# Patient Record
Sex: Female | Born: 1965
Health system: Southern US, Community
[De-identification: ages and names within clinical notes are randomized; demographics above are authoritative.]

## PROBLEM LIST (undated history)

## (undated) DIAGNOSIS — E669 Obesity, unspecified: Secondary | ICD-10-CM

## (undated) DIAGNOSIS — R809 Proteinuria, unspecified: Secondary | ICD-10-CM

## (undated) DIAGNOSIS — E785 Hyperlipidemia, unspecified: Secondary | ICD-10-CM

## (undated) DIAGNOSIS — R51 Headache: Secondary | ICD-10-CM

## (undated) DIAGNOSIS — I10 Essential (primary) hypertension: Secondary | ICD-10-CM

## (undated) DIAGNOSIS — D219 Benign neoplasm of connective and other soft tissue, unspecified: Secondary | ICD-10-CM

## (undated) HISTORY — DX: Headache: R51

## (undated) HISTORY — DX: Proteinuria, unspecified: R80.9

## (undated) HISTORY — PX: ENDOMETRIAL ABLATION: SHX621

## (undated) HISTORY — DX: Essential (primary) hypertension: I10

## (undated) HISTORY — DX: Benign neoplasm of connective and other soft tissue, unspecified: D21.9

## (undated) HISTORY — PX: WISDOM TOOTH EXTRACTION: SHX21

## (undated) HISTORY — DX: Obesity, unspecified: E66.9

## (undated) HISTORY — DX: Hyperlipidemia, unspecified: E78.5

---

## 1997-08-14 ENCOUNTER — Inpatient Hospital Stay (HOSPITAL_COMMUNITY): Admission: AD | Admit: 1997-08-14 | Discharge: 1997-08-17 | Payer: Self-pay | Admitting: Obstetrics and Gynecology

## 1998-03-03 ENCOUNTER — Emergency Department (HOSPITAL_COMMUNITY): Admission: EM | Admit: 1998-03-03 | Discharge: 1998-03-03 | Payer: Self-pay | Admitting: Emergency Medicine

## 1998-03-13 ENCOUNTER — Other Ambulatory Visit: Admission: RE | Admit: 1998-03-13 | Discharge: 1998-03-13 | Payer: Self-pay | Admitting: *Deleted

## 1998-06-18 ENCOUNTER — Encounter: Admission: RE | Admit: 1998-06-18 | Discharge: 1998-06-18 | Payer: Self-pay | Admitting: *Deleted

## 2000-12-06 ENCOUNTER — Other Ambulatory Visit: Admission: RE | Admit: 2000-12-06 | Discharge: 2000-12-06 | Payer: Self-pay | Admitting: Obstetrics and Gynecology

## 2001-12-12 ENCOUNTER — Other Ambulatory Visit: Admission: RE | Admit: 2001-12-12 | Discharge: 2001-12-12 | Payer: Self-pay | Admitting: Obstetrics and Gynecology

## 2004-01-23 ENCOUNTER — Other Ambulatory Visit: Admission: RE | Admit: 2004-01-23 | Discharge: 2004-01-23 | Payer: Self-pay | Admitting: Obstetrics and Gynecology

## 2004-11-06 ENCOUNTER — Other Ambulatory Visit: Admission: RE | Admit: 2004-11-06 | Discharge: 2004-11-06 | Payer: Self-pay | Admitting: Obstetrics and Gynecology

## 2005-12-16 ENCOUNTER — Other Ambulatory Visit: Admission: RE | Admit: 2005-12-16 | Discharge: 2005-12-16 | Payer: Self-pay | Admitting: Obstetrics and Gynecology

## 2007-01-28 ENCOUNTER — Encounter: Admission: RE | Admit: 2007-01-28 | Discharge: 2007-01-28 | Payer: Self-pay | Admitting: Obstetrics and Gynecology

## 2007-02-14 ENCOUNTER — Ambulatory Visit (HOSPITAL_COMMUNITY): Admission: RE | Admit: 2007-02-14 | Discharge: 2007-02-14 | Payer: Self-pay | Admitting: Obstetrics and Gynecology

## 2007-02-14 ENCOUNTER — Encounter (INDEPENDENT_AMBULATORY_CARE_PROVIDER_SITE_OTHER): Payer: Self-pay | Admitting: Obstetrics and Gynecology

## 2010-11-25 NOTE — Op Note (Signed)
NAME:  Kathleen Mata, Kathleen Mata                   ACCOUNT NO.:  [id number]   MEDICAL RECORD NO.:  [id number]          PATIENT TYPE:  AMB   LOCATION:  SDC                           FACILITY:  [facility]   PHYSICIAN:  Naima A. Dillard, M.D. DATE OF BIRTH:  03/06/1966   DATE OF PROCEDURE:  02/14/2007  DATE OF DISCHARGE:  02/14/2007                               OPERATIVE REPORT   PREOPERATIVE DIAGNOSIS:  Irregular bleeding and uterine fibroids.   POSTOPERATIVE DIAGNOSIS:  Irregular bleeding and submucosal uterine  fibroid.   PROCEDURES:  A dilation and curettage hysteroscopy, hysteroscopic  resection of uterine fibroid and ThermaChoice endometrial ablation.   SURGEON:  Naima A. Dillard, M.D.   ASSISTANTS:  None.   ANESTHESIA:  General laryngeal mask airway and cervical block with  lidocaine.   COMPLICATIONS:  None.   FINDINGS:  10 weeks size uterus.  No adnexal masses.  A small submucosal  fibroid at the fundus of the uterus.  Both ostia were visualized.  There  are no endometrial polyps noted.  All specimens were sent to pathology  and endometrial curettings and the fibroid sent to pathology.  There  were no complications.  The patient to recovery room in stable  condition.   PROCEDURE IN DETAIL:  The patient was taken to the operating room where  she was given general anesthesia, placed in dorsal lithotomy position  and prepped and draped in a normal sterile fashion.  A bivalve speculum  was placed into the vagina.  The anterior lip of the cervix was grasped  with single-tooth tenaculum and the uterus did sound to 10 cm.  The  hysteroscope was placed into the uterine cavity and there was a small  submucosal fibroid seen at the fundus.  Both ostia were visualized.  No  polyps were seen.  The hysteroscope was then removed.  The cervix was  further dilated with [facility] dilators.  The resectoscope was placed into  the uterine cavity and using a single loop with cautery, the fibroid was  removed  without difficulty and looking a second time, the fibroid was  then removed in its entirety and sent to pathology.  Sharp curettage was  then done and endometrial curettings were all sent to pathology.  At  this time the ThermaChoice endometrial ablation was done per protocol  without difficulty.  After looking after ThermaChoice, most of the  cavity and the entire portion of the cavity was ablated without  difficulty.  There were no complications.  All instruments were removed  from the vagina.  Sponge, lap and needle counts were correct.  The  patient to recovery room in stable condition.     Naima A. Normand Sloop, M.D.  Electronically Signed    NAD/MEDQ  D:  02/15/2007  T:  02/16/2007  Job:  [id number]

## 2011-04-27 LAB — CBC
HCT: 38.4
Hemoglobin: 13.2
MCHC: 34.4
MCV: 91
Platelets: 243
RBC: 4.22
RDW: 12.2
WBC: 4.7

## 2011-04-27 LAB — HCG, QUANTITATIVE, PREGNANCY: hCG, Beta Chain, Quant, S: <2

## 2011-05-20 ENCOUNTER — Encounter: Payer: Self-pay | Admitting: Physician Assistant

## 2011-05-20 DIAGNOSIS — Z6838 Body mass index (BMI) 38.0-38.9, adult: Secondary | ICD-10-CM | POA: Insufficient documentation

## 2011-05-20 DIAGNOSIS — R519 Headache, unspecified: Secondary | ICD-10-CM | POA: Insufficient documentation

## 2011-05-20 DIAGNOSIS — R809 Proteinuria, unspecified: Secondary | ICD-10-CM | POA: Insufficient documentation

## 2011-05-20 DIAGNOSIS — E559 Vitamin D deficiency, unspecified: Secondary | ICD-10-CM | POA: Insufficient documentation

## 2011-05-20 DIAGNOSIS — D219 Benign neoplasm of connective and other soft tissue, unspecified: Secondary | ICD-10-CM | POA: Insufficient documentation

## 2011-05-20 DIAGNOSIS — I10 Essential (primary) hypertension: Secondary | ICD-10-CM | POA: Insufficient documentation

## 2011-07-28 ENCOUNTER — Encounter (INDEPENDENT_AMBULATORY_CARE_PROVIDER_SITE_OTHER): Payer: Federal, State, Local not specified - PPO | Admitting: Physician Assistant

## 2011-07-28 DIAGNOSIS — I10 Essential (primary) hypertension: Secondary | ICD-10-CM

## 2011-10-24 ENCOUNTER — Ambulatory Visit (INDEPENDENT_AMBULATORY_CARE_PROVIDER_SITE_OTHER): Payer: Federal, State, Local not specified - PPO | Admitting: Family Medicine

## 2011-10-24 ENCOUNTER — Ambulatory Visit: Payer: Federal, State, Local not specified - PPO

## 2011-10-24 VITALS — BP 143/84 | HR 50 | Temp 99.0°F | Resp 18 | Ht 68.0 in | Wt 251.6 lb

## 2011-10-24 DIAGNOSIS — M79672 Pain in left foot: Secondary | ICD-10-CM

## 2011-10-24 DIAGNOSIS — M79609 Pain in unspecified limb: Secondary | ICD-10-CM

## 2011-10-24 DIAGNOSIS — M722 Plantar fascial fibromatosis: Secondary | ICD-10-CM

## 2011-10-24 MED ORDER — OXAPROZIN 600 MG PO TABS: 600.0000 mg | ORAL_TABLET | Freq: Two times a day (BID) | ORAL | Status: DC

## 2011-10-24 NOTE — Patient Instructions (Addendum)
Plantar Fasciitis Plantar fasciitis is a common condition that causes foot pain. It is soreness (inflammation) of the band of tough fibrous tissue on the bottom of the foot that runs from the heel bone (calcaneus) to the ball of the foot. The cause of this soreness may be from excessive standing, poor fitting shoes, running on hard surfaces, being overweight, having an abnormal walk, or overuse (this is common in runners) of the painful foot or feet. It is also common in aerobic exercise dancers and ballet dancers. SYMPTOMS  Most people with plantar fasciitis complain of:  Severe pain in the morning on the bottom of their foot especially when taking the first steps out of bed. This pain recedes after a few minutes of walking.   Severe pain is experienced also during walking following a long period of inactivity.   Pain is worse when walking barefoot or up stairs  DIAGNOSIS   Your caregiver will diagnose this condition by examining and feeling your foot.   Special tests such as X-rays of your foot, are usually not needed.  PREVENTION   Consult a sports medicine professional before beginning a new exercise program.   Walking programs offer a good workout. With walking there is a lower chance of overuse injuries common to runners. There is less impact and less jarring of the joints.   Begin all new exercise programs slowly. If problems or pain develop, decrease the amount of time or distance until you are at a comfortable level.   Wear good shoes and replace them regularly.   Stretch your foot and the heel cords at the back of the ankle (Achilles tendon) both before and after exercise.   Run or exercise on even surfaces that are not hard. For example, asphalt is better than pavement.   Do not run barefoot on hard surfaces.   If using a treadmill, vary the incline.   Do not continue to workout if you have foot or joint problems. Seek professional help if they do not improve.  HOME CARE  INSTRUCTIONS   Avoid activities that cause you pain until you recover.   Use ice or cold packs on the problem or painful areas after working out.   Only take over-the-counter or prescription medicines for pain, discomfort, or fever as directed by your caregiver.   Soft shoe inserts or athletic shoes with air or gel sole cushions may be helpful.   If problems continue or become more severe, consult a sports medicine caregiver or your own health care provider. Cortisone is a potent anti-inflammatory medication that may be injected into the painful area. You can discuss this treatment with your caregiver.  MAKE SURE YOU:   Understand these instructions.   Will watch your condition.  Will get help right away if you are not doing well or get worse.     Document Released: 03/24/2001 Document Revised: 06/18/2011 Document Reviewed: 05/23/2008 [facility] Patient Information 2012 [place], [place].  Advise wearing a heel cup I shoe insert. Stretch the heel faithfully. If symptoms are not improving over the next 2 weeks return and I would be happy to put an injection of cortisone into that heel

## 2011-10-24 NOTE — Progress Notes (Signed)
Subjective: Patient has been having problems with pain in her left heel for about one month. She walks on hard floors all day at the [employer] where she works. No specific injury.  Objective: Overweight Afro- American female in no acute distress. Pedal pulses are good. Motion of the toes is normal. Very tender at the anterior aspect of the left calcaneus. Mild tenderness at the posterior aspect of the heel.  Assessment: Foot pain Plantar fasciitis  Plan: X-ray calcaneus

## 2011-11-03 ENCOUNTER — Ambulatory Visit: Payer: Federal, State, Local not specified - PPO | Admitting: Physician Assistant

## 2011-12-29 ENCOUNTER — Encounter: Payer: Self-pay | Admitting: Physician Assistant

## 2011-12-29 ENCOUNTER — Ambulatory Visit (INDEPENDENT_AMBULATORY_CARE_PROVIDER_SITE_OTHER): Payer: Federal, State, Local not specified - PPO | Admitting: Physician Assistant

## 2011-12-29 VITALS — BP 145/89 | HR 63 | Temp 98.6°F | Resp 16 | Ht 68.0 in | Wt 247.2 lb

## 2011-12-29 DIAGNOSIS — R7309 Other abnormal glucose: Secondary | ICD-10-CM

## 2011-12-29 DIAGNOSIS — M722 Plantar fascial fibromatosis: Secondary | ICD-10-CM

## 2011-12-29 DIAGNOSIS — I10 Essential (primary) hypertension: Secondary | ICD-10-CM

## 2011-12-29 DIAGNOSIS — R739 Hyperglycemia, unspecified: Secondary | ICD-10-CM

## 2011-12-29 DIAGNOSIS — R809 Proteinuria, unspecified: Secondary | ICD-10-CM

## 2011-12-29 LAB — GLUCOSE, POCT (MANUAL RESULT ENTRY): POC Glucose: 85 mg/dl (ref 70–99)

## 2011-12-29 MED ORDER — ENALAPRIL MALEATE 10 MG PO TABS: 10.0000 mg | ORAL_TABLET | Freq: Every day | ORAL | Status: DC

## 2011-12-29 MED ORDER — METOPROLOL SUCCINATE ER 100 MG PO TB24: 100.0000 mg | ORAL_TABLET | Freq: Every day | ORAL | Status: DC

## 2011-12-29 MED ORDER — MELOXICAM 15 MG PO TABS: 15.0000 mg | ORAL_TABLET | Freq: Every day | ORAL | Status: AC

## 2011-12-29 NOTE — Patient Instructions (Signed)
Depending on your other lab results, we may need to increase the vasotec, or add another medication to your regimen.  If you haven't heard from me in 2 weeks, please call the office.  Keep working on healthy eating and regular exercise!

## 2011-12-29 NOTE — Progress Notes (Signed)
  Subjective:    Patient ID: Kathleen Mata, female    DOB: 1966/05/05, 46 y.o.   MRN: [id number]  HPI Presents for re-evaluation of HTN, microalbuminuria, obesity and HA.  HA occurs 1-2 x monthly, usually associated with elevated BP.  HTN much improved, but still not to goal.  Plantar fasciitis not improved with DayPro and exercises.   Review of Systems No chest pain, SOB, dizziness, vision change, N/V, diarrhea, constipation, dysuria, urinary urgency or frequency, myalgias, arthralgias or rash.     Objective:   Physical Exam  Vital signs noted. Well-developed, well nourished BF who is awake, alert and oriented, in NAD. HEENT: [place]/AT, PERRL, EOMI.  Sclera and conjunctiva are clear.  Neck: supple, non-tender, no lymphadenopathy, thyromegaly. Heart: RRR, no murmur Lungs: CTA Extremities: no cyanosis, clubbing or edema. Skin: warm and dry without rash.  Results for orders placed in visit on 12/29/11  GLUCOSE, POCT (MANUAL RESULT ENTRY)      Component Value Range   POC Glucose 85  70 - 99 mg/dl  POCT GLYCOSYLATED HEMOGLOBIN (HGB A1C)      Component Value Range   Hemoglobin A1C 5.3          Assessment & Plan:   1. HTN (hypertension)  Comprehensive metabolic panel, TSH, enalapril (VASOTEC) 10 MG tablet, metoprolol succinate (TOPROL-XL) 100 MG 24 hr tablet  2. Plantar fasciitis of left foot  meloxicam (MOBIC) 15 MG tablet, Ambulatory referral to Sports Medicine  3. Hyperglycemia  POCT glucose (manual entry), POCT glycosylated hemoglobin (Hb A1C), Lipid panel  4. Microalbuminuria  Microalbumin, urine   Check BP at home for the next several days.  If >140/90, and microalbuminuria persists, will increase enalapril.  If urine microalbumin is resolved, consider the addition of chlorthalidone instead.

## 2011-12-30 ENCOUNTER — Encounter: Payer: Self-pay | Admitting: Physician Assistant

## 2011-12-30 LAB — LIPID PANEL
Cholesterol: 230 mg/dL — ABNORMAL HIGH (ref 0–200)
HDL: 55 mg/dL (ref >39)
Total CHOL/HDL Ratio: 4.2 Ratio
Triglycerides: 77 mg/dL (ref <150)
VLDL: 15 mg/dL (ref 0–40)

## 2011-12-30 LAB — COMPREHENSIVE METABOLIC PANEL
Alkaline Phosphatase: 66 U/L (ref 39–117)
Creat: 0.79 mg/dL (ref 0.50–1.10)
Glucose, Bld: 83 mg/dL (ref 70–99)
Sodium: 141 mEq/L (ref 135–145)
Total Bilirubin: 0.5 mg/dL (ref 0.3–1.2)
Total Protein: 7.8 g/dL (ref 6.0–8.3)

## 2011-12-30 MED ORDER — PRAVASTATIN SODIUM 20 MG PO TABS: 20.0000 mg | ORAL_TABLET | Freq: Every day | ORAL | Status: DC

## 2011-12-30 MED ORDER — ENALAPRIL MALEATE 20 MG PO TABS: 20.0000 mg | ORAL_TABLET | Freq: Every day | ORAL | Status: DC

## 2011-12-30 NOTE — Addendum Note (Signed)
Addended by: Fernande Bras on: 12/30/2011 12:24 PM   Modules accepted: Orders

## 2012-01-05 ENCOUNTER — Ambulatory Visit (INDEPENDENT_AMBULATORY_CARE_PROVIDER_SITE_OTHER): Payer: Federal, State, Local not specified - PPO | Admitting: Family Medicine

## 2012-01-05 ENCOUNTER — Encounter: Payer: Self-pay | Admitting: Family Medicine

## 2012-01-05 VITALS — BP 153/85 | HR 70 | Ht 68.0 in | Wt 227.0 lb

## 2012-01-05 DIAGNOSIS — M722 Plantar fascial fibromatosis: Secondary | ICD-10-CM

## 2012-01-05 NOTE — Progress Notes (Signed)
[facility] [address] [place], [place] [address] Phone: [phone] Fax: [phone]   Patient Name: Kathleen Mata Date of Birth: 1965-09-05 Medical Record Number: [id number] Gender: female Date of Encounter: 01/05/2012  History of Present Illness:  Kathleen Mata is a 46 y.o. very pleasant female patient who presents with the following:  The patient presents with a 3-4 mo long history of L heel pain. This is notable for worsening pain first thing in the morning when arising and standing after sitting.   Prior foot or ankle fractures: none Prior operations: none Orthotics or bracing: none Medications: Daypro PT or home rehab: has not tried Night splints: no Ice massage: no Ball massage: no  Metatarsal pain: no   Patient Active Problem List  Diagnosis  . Essential hypertension, benign  . Microalbuminuria  . Vitamin d deficiency  . Obesity, unspecified  . Fibroids  . Headache   Past Medical History  Diagnosis Date  . Essential hypertension, benign   . Microalbuminuria   . Vitamin d deficiency   . Obesity, unspecified   . Fibroids   . Headache    Past Surgical History  Procedure Date  . Wisdom tooth extraction   . Endometrial ablation    History  Substance Use Topics  . Smoking status: Never Smoker   . Smokeless tobacco: Not on file  . Alcohol Use: Not on file   No family history on file. No Known Allergies  Medication list has been reviewed and updated.  Prior to Admission medications   Medication Sig Start Date End Date Taking? Authorizing Provider  enalapril (VASOTEC) 20 MG tablet Take 1 tablet (20 mg total) by mouth daily. 12/30/11 12/29/12  Chelle S Jeffery, PA-C  meloxicam (MOBIC) 15 MG tablet Take 1 tablet (15 mg total) by mouth daily. 12/29/11 12/28/12  Chelle S Jeffery, PA-C  metoprolol succinate (TOPROL-XL) 100 MG 24 hr tablet Take 1 tablet (100 mg total) by mouth daily. 12/29/11   Chelle S Jeffery, PA-C    pravastatin (PRAVACHOL) 20 MG tablet Take 1 tablet (20 mg total) by mouth daily. Take in the evening. 12/30/11 12/29/12  Chelle Tessa Lerner, PA-C    Review of Systems:   GEN: No fevers, chills. Nontoxic. Primarily MSK c/o today. MSK: Detailed in the HPI GI: tolerating PO intake without difficulty Neuro: No numbness, parasthesias, or tingling associated. Otherwise the pertinent positives of the ROS are noted above.    Physical Examination: Filed Vitals:   01/05/12 1417  BP: 153/85  Pulse: 70   Filed Vitals:   01/05/12 1417  Height: 5\' 8"  (1.727 m)  Weight: 227 lb (102.967 kg)   Body mass index is 34.52 kg/(m^2).   GEN: WDWN, NAD, Non-toxic, Alert & Oriented x 3 HEENT: Atraumatic, Normocephalic.  Ears and Nose: No external deformity. EXTR: No clubbing/cyanosis/edema NEURO: Normal gait.  PSYCH: Normally interactive. Conversant. Not depressed or anxious appearing.  Calm demeanor.   Foot exam L Echymosis: no Edema: no ROM: full LE B Gait: heel toe, non-antalgic MT pain: no Callus pattern: none Lateral Mall: NT Medial Mall: NT Talus: NT Navicular: NT Calcaneous: NT Metatarsals: NT 5th MT: NT Phalanges: NT Achilles: NT Plantar Fascia: tender, medial along PF. Pain with forced dorsi Fat Pad: NT Peroneals: NT Post Tib: NT Great Toe: Nml motion Ant Drawer: neg Other foot breakdown: none Long arch: preserved relatively Transverse arch: preserved, mild bunion formation Hindfoot breakdown: none Sensation: intact   Assessment and Plan: 1. Plantar fascia  syndrome     Plantar Fascitis: We reviewed that stretching is critically important to the treatment of PF. Reviewed footwear. Rigid soles have been shown to help with PF. Reviewed rehab of stretching and calf raises - given program Could benefit from a corticosteroid injection, orthotics, or other measures if conservative treatment fails.   For now, made a poron bar for heel on hapad sports insoles, and will use  arch binders along with rehab  I appreciate the opportunity to evaluate this very friendly patient. If you have any question regarding her care or prognosis, do not hesitate to ask.   Hannah Beat, MD

## 2012-01-11 ENCOUNTER — Telehealth: Payer: Self-pay

## 2012-01-11 NOTE — Telephone Encounter (Signed)
Pt called left message on billing voice mail.  She wants her FMLA paperwork that was left with Porfirio Oar.

## 2012-04-05 ENCOUNTER — Ambulatory Visit: Payer: Federal, State, Local not specified - PPO | Admitting: Physician Assistant

## 2012-05-03 ENCOUNTER — Ambulatory Visit: Payer: Federal, State, Local not specified - PPO | Admitting: Physician Assistant

## 2012-06-08 ENCOUNTER — Ambulatory Visit (INDEPENDENT_AMBULATORY_CARE_PROVIDER_SITE_OTHER): Payer: Federal, State, Local not specified - PPO | Admitting: Family Medicine

## 2012-06-08 VITALS — BP 148/92 | HR 97 | Temp 98.8°F | Resp 16 | Ht 68.5 in | Wt 252.8 lb

## 2012-06-08 DIAGNOSIS — K529 Noninfective gastroenteritis and colitis, unspecified: Secondary | ICD-10-CM

## 2012-06-08 DIAGNOSIS — K5289 Other specified noninfective gastroenteritis and colitis: Secondary | ICD-10-CM

## 2012-06-08 MED ORDER — METRONIDAZOLE 250 MG PO TABS: 250.0000 mg | ORAL_TABLET | Freq: Three times a day (TID) | ORAL | Status: DC

## 2012-06-08 NOTE — Progress Notes (Signed)
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  Her son is also feeling sick.  Patient thinks this may have been caused by Elizabeth's food ordered Sunday and she ate the rest of it yesterday am for breakfast.  No fever, hematochezia, cramps  Objective:  NAD Chest:  Clear Heart: reg, no murmur Abdomen:  Soft, nontender, overweight, hyperactive BS Skin:  Clear    1. Gastroenteritis  metroNIDAZOLE (FLAGYL) 250 MG tablet   Probiotics as well

## 2012-06-08 NOTE — Patient Instructions (Signed)
No dairy until tomorrow.   Take probiotics (Culturelle, Align) for three days.  Viral Gastroenteritis Viral gastroenteritis is also known as stomach flu. This condition affects the stomach and intestinal tract. It can cause sudden diarrhea and vomiting. The illness typically lasts 3 to 8 days. Most people develop an immune response that eventually gets rid of the virus. While this natural response develops, the virus can make you quite ill. CAUSES  Many different viruses can cause gastroenteritis, such as rotavirus or noroviruses. You can catch one of these viruses by consuming contaminated food or water. You may also catch a virus by sharing utensils or other personal items with an infected person or by touching a contaminated surface. SYMPTOMS  The most common symptoms are diarrhea and vomiting. These problems can cause a severe loss of body fluids (dehydration) and a body salt (electrolyte) imbalance. Other symptoms may include:  Fever.  Headache.  Fatigue.  Abdominal pain. DIAGNOSIS  Your caregiver can usually diagnose viral gastroenteritis based on your symptoms and a physical exam. A stool sample may also be taken to test for the presence of viruses or other infections. TREATMENT  This illness typically goes away on its own. Treatments are aimed at rehydration. The most serious cases of viral gastroenteritis involve vomiting so severely that you are not able to keep fluids down. In these cases, fluids must be given through an intravenous line (IV). HOME CARE INSTRUCTIONS   Drink enough fluids to keep your urine clear or pale yellow. Drink small amounts of fluids frequently and increase the amounts as tolerated.  Ask your caregiver for specific rehydration instructions.  Avoid:  Foods high in sugar.  Alcohol.  Carbonated drinks.  Tobacco.  Juice.  Caffeine drinks.  Extremely hot or cold fluids.  Fatty, greasy foods.  Too much intake of anything at one time.  Dairy  products until 24 to 48 hours after diarrhea stops.  You may consume probiotics. Probiotics are active cultures of beneficial bacteria. They may lessen the amount and number of diarrheal stools in adults. Probiotics can be found in yogurt with active cultures and in supplements.  Wash your hands well to avoid spreading the virus.  Only take over-the-counter or prescription medicines for pain, discomfort, or fever as directed by your caregiver. Do not give aspirin to children. Antidiarrheal medicines are not recommended.  Ask your caregiver if you should continue to take your regular prescribed and over-the-counter medicines.  Keep all follow-up appointments as directed by your caregiver. SEEK IMMEDIATE MEDICAL CARE IF:   You are unable to keep fluids down.  You do not urinate at least once every 6 to 8 hours.  You develop shortness of breath.  You notice blood in your stool or vomit. This may look like coffee grounds.  You have abdominal pain that increases or is concentrated in one small area (localized).  You have persistent vomiting or diarrhea.  You have a fever.  The patient is a child younger than 3 months, and he or she has a fever.  The patient is a child older than 3 months, and he or she has a fever and persistent symptoms.  The patient is a child older than 3 months, and he or she has a fever and symptoms suddenly get worse.  The patient is a baby, and he or she has no tears when crying. MAKE SURE YOU:   Understand these instructions.  Will watch your condition.  Will get help right away if you are not  doing well or get worse. Document Released: 06/29/2005 Document Revised: 09/21/2011 Document Reviewed: 04/15/2011 [facility] Patient Information 2013 [place], [place]. [place], [place]) for three days.

## 2012-07-20 ENCOUNTER — Other Ambulatory Visit: Payer: Self-pay | Admitting: Physician Assistant

## 2012-07-22 NOTE — Telephone Encounter (Signed)
Needs OV/labs - last June 2013

## 2012-07-28 ENCOUNTER — Other Ambulatory Visit: Payer: Self-pay | Admitting: Physician Assistant

## 2012-07-28 ENCOUNTER — Ambulatory Visit: Payer: Federal, State, Local not specified - PPO | Admitting: Physician Assistant

## 2012-08-16 ENCOUNTER — Ambulatory Visit: Payer: Federal, State, Local not specified - PPO | Admitting: Obstetrics and Gynecology

## 2012-08-16 ENCOUNTER — Encounter: Payer: Self-pay | Admitting: Obstetrics and Gynecology

## 2012-08-16 VITALS — BP 122/80 | HR 76 | Ht 67.75 in | Wt 256.0 lb

## 2012-08-16 DIAGNOSIS — Z1231 Encounter for screening mammogram for malignant neoplasm of breast: Secondary | ICD-10-CM

## 2012-08-16 DIAGNOSIS — Z124 Encounter for screening for malignant neoplasm of cervix: Secondary | ICD-10-CM

## 2012-08-16 NOTE — Progress Notes (Signed)
Last Pap: 01/04/07 WNL: Yes Regular Periods:no Contraception: none   Monthly Breast exam:yes Tetanus<73yrs:yes Nl.Bladder Function:yes Daily BMs:yes Healthy Diet:no Calcium:no Mammogram:yes Date of Mammogram: 2 years ago per pt Exercise:yes Have often Exercise: just starting  Seatbelt: yes Abuse at home: no Stressful work:yes Sigmoid-colonoscopy: n/a Bone Density: No PCP: Dr. Leotis Shames  Change in PMH: no changes Change in [facility]: no changes BP 122/80  Pulse 76  Ht 5' 7.75" (1.721 m)  Wt 256 lb (116.121 kg)  BMI 39.21 kg/m2  LMP 05/13/2012 Pt with complaints:no Physical Examination: General appearance - alert, well appearing, and in no distress Mental status - normal mood, behavior, speech, dress, motor activity, and thought processes Neck - supple, no significant adenopathy,  thyroid exam: thyroid is normal in size without nodules or tenderness Chest - clear to auscultation, no wheezes, rales or rhonchi, symmetric air entry Heart - normal rate and regular rhythm Abdomen - soft, nontender, nondistended, no masses or organomegaly Breasts - breasts appear normal, no suspicious masses, no skin or nipple changes or axillary nodes Pelvic - normal external genitalia, vulva, vagina, cervix, uterus and adnexa Rectal - rectal exam not indicated Back exam - full range of motion, no tenderness, palpable spasm or pain on motion Neurological - alert, oriented, normal speech, no focal findings or movement disorder noted Musculoskeletal - no joint tenderness, deformity or swelling Extremities - no edema, redness or tenderness in the calves or thighs Skin - normal coloration and turgor, no rashes, no suspicious skin lesions noted Routine exam Pap sent yes Mammogram due yes it was scheduled nothing used for contraception RT 1 yr

## 2012-08-17 LAB — PAP IG W/ RFLX HPV ASCU

## 2012-08-18 ENCOUNTER — Telehealth: Payer: Self-pay

## 2012-08-18 NOTE — Telephone Encounter (Signed)
Lm on vm tcb rgd labs 

## 2012-08-18 NOTE — Telephone Encounter (Signed)
Message copied by Rolla Plate on Thu Aug 18, 2012  1:14 PM ------      Message from: Jaymes Graff      Created: Thu Aug 18, 2012  9:53 AM       Please tell pt BV was found on her pap smear.  She can be treated with either Metrogel one applicator in vagina QHS for five nights or Flagyl 500mg  one tablet twice a day for seven days.

## 2012-08-19 MED ORDER — METRONIDAZOLE 500 MG PO TABS: 500.0000 mg | ORAL_TABLET | Freq: Two times a day (BID) | ORAL | Status: DC

## 2012-08-19 NOTE — Telephone Encounter (Signed)
Spoke with pt informed lab results rx sent to pharm pt voice understanding

## 2012-09-24 ENCOUNTER — Ambulatory Visit (INDEPENDENT_AMBULATORY_CARE_PROVIDER_SITE_OTHER): Payer: Federal, State, Local not specified - PPO | Admitting: Emergency Medicine

## 2012-09-24 VITALS — BP 148/90 | HR 77 | Temp 97.7°F | Resp 16

## 2012-09-24 DIAGNOSIS — A088 Other specified intestinal infections: Secondary | ICD-10-CM

## 2012-09-24 MED ORDER — ONDANSETRON 8 MG PO TBDP: 8.0000 mg | ORAL_TABLET | Freq: Three times a day (TID) | ORAL | Status: DC | PRN

## 2012-09-24 MED ORDER — LOPERAMIDE HCL 2 MG PO TABS: ORAL_TABLET | ORAL | Status: DC

## 2012-09-24 NOTE — Progress Notes (Signed)
[facility] [address] [place] [address] [phone]  Date:  09/24/2012   Name:  Kathleen Mata   DOB:  June 24, 1966   MRN:  [id number]  PCP:  JEFFERY,CHELLE, PA-C    Chief Complaint: Headache and Diarrhea   History of Present Illness:  Kathleen Mata is a 47 y.o. very pleasant female patient who presents with the following:  Diarrhea for past few days.  5 loose watery stools yesterday.  The patient has no complaint of blood, mucous, or pus in her stools. No nausea or vomiting.  Sick contact with friend's child.  No fever or chills.  No improvement with over the counter medications or other home remedies. Denies other complaint or health concern today.   Patient Active Problem List  Diagnosis  . Essential hypertension, benign  . Microalbuminuria  . Vitamin d deficiency  . Obesity, unspecified  . Fibroids  . Headache    Past Medical History  Diagnosis Date  . Essential hypertension, benign   . Microalbuminuria   . Vitamin D deficiency   . Obesity, unspecified   . Fibroids   . Headache     Past Surgical History  Procedure Laterality Date  . Wisdom tooth extraction    . Endometrial ablation      History  Substance Use Topics  . Smoking status: Never Smoker   . Smokeless tobacco: Not on file  . Alcohol Use: Yes    Family History  Problem Relation Age of Onset  . Hypertension Mother   . Heart disease Mother   . Hypertension Father   . Kidney disease Father     No Known Allergies  Medication list has been reviewed and updated.  Current Outpatient Prescriptions on File Prior to Visit  Medication Sig Dispense Refill  . enalapril (VASOTEC) 20 MG tablet Take 1 tablet (20 mg total) by mouth daily.  90 tablet  3  . metoprolol succinate (TOPROL-XL) 100 MG 24 hr tablet Take 1 tablet (100 mg total) by mouth daily. NEED VISIT/LABS  90 tablet  0  . pravastatin (PRAVACHOL) 20 MG tablet Take 1 tablet (20 mg total) by mouth daily. Take in the  evening.  90 tablet  3  . meloxicam (MOBIC) 15 MG tablet Take 1 tablet (15 mg total) by mouth daily.  30 tablet  1  . metroNIDAZOLE (FLAGYL) 250 MG tablet Take 1 tablet (250 mg total) by mouth 3 (three) times daily.  6 tablet  0  . metroNIDAZOLE (FLAGYL) 500 MG tablet Take 1 tablet (500 mg total) by mouth 2 (two) times daily with a meal.  14 tablet  0   No current facility-administered medications on file prior to visit.    Review of Systems:  As per HPI, otherwise negative.    Physical Examination: Filed Vitals:   09/24/12 1135  BP: 148/90  Pulse: 77  Temp: 97.7 F (36.5 C)  Resp: 16   There were no vitals filed for this visit. There is no weight on file to calculate BMI. Ideal Body Weight:    GEN: WDWN, NAD, Non-toxic, A & O x 3 HEENT: Atraumatic, Normocephalic. Neck supple. No masses, No LAD. Ears and Nose: No external deformity. CV: RRR, No M/G/R. No JVD. No thrill. No extra heart sounds. PULM: CTA B, no wheezes, crackles, rhonchi. No retractions. No resp. distress. No accessory muscle use. ABD: S, NT, ND, +BS. No rebound. No HSM. EXTR: No c/c/e NEURO Normal gait.  PSYCH: Normally  interactive. Conversant. Not depressed or anxious appearing.  Calm demeanor.    Assessment and Plan: Viral gastroenteritis Imodium zofran  Carmelina Dane, MD

## 2012-09-24 NOTE — Patient Instructions (Addendum)
Clear Liquid Diet The clear liquid dietconsists of foods that are liquid or will become liquid at room temperature.You should be able to see through the liquid and beverages. Examples of foods allowed on a clear liquid diet include fruit juice, broth or bouillon, gelatin, or frozen ice pops. The purpose of this diet is to provide necessary fluid, electrolytes such as sodium and potassium, and energy to keep the body functioning during times when you are not able to consume a regular diet.A clear liquid diet should not be continued for long periods of time as it is not nutritionally adequate.  REASONS FOR USING A CLEAR LIQUID DIET  In sudden onset (acute) conditions for a patient before or after surgery.  As the first step in oral feeding.  For fluid and electrolyte replacement in diarrheal diseases.  As a diet before certain medical tests are performed. ADEQUACY The clear liquid diet is adequate only in ascorbic acid, according to the Recommended Dietary Allowances of the [employer]. CHOOSING FOODS Breads and Starches  Allowed:  None are allowed.  Avoid: All are avoided. Vegetables  Allowed:  Strained tomato or vegetable juice.  Avoid: Any others. Fruit  Allowed:  Strained fruit juices and fruit drinks. Include 1 serving of citrus or vitamin C-enriched fruit juice daily.  Avoid: Any others. Meat and Meat Substitutes  Allowed:  None are allowed.  Avoid: All are avoided. Milk  Allowed:  None are allowed.  Avoid: All are avoided. Soups and Combination Foods  Allowed:  Clear bouillon, broth, or strained broth-based soups.  Avoid: Any others. Desserts and Sweets  Allowed:  Sugar, honey. High protein gelatin. Flavored gelatin, ices, or frozen ice pops that do not contain milk.  Avoid: Any others. Fats and Oils  Allowed:  None are allowed.  Avoid: All are avoided. Beverages  Allowed: Cereal beverages, coffee (regular or decaffeinated), tea, or soda  at the discretion of your caregiver.  Avoid: Any others. Condiments  Allowed:  Iodized salt.  Avoid: Any others, including pepper. Supplements  Allowed:  Liquid nutrition beverages.  Avoid: Any others that contain lactose or fiber. SAMPLE MEAL PLAN Breakfast  4 oz (120 mL) strained orange juice.   to 1 cup (125 to 250 mL) gelatin (plain or fortified).  1 cup (250 mL) beverage (coffee or tea).  Sugar, if desired. Midmorning Snack   cup (125 mL) gelatin (plain or fortified). Lunch  1 cup (250 mL) broth or consomm.  4 oz (120 mL) strained grapefruit juice.   cup (125 mL) gelatin (plain or fortified).  1 cup (250 mL) beverage (coffee or tea).  Sugar, if desired. Midafternoon Snack   cup (125 mL) fruit ice.   cup (125 mL) strained fruit juice. Dinner  1 cup (250 mL) broth or consomm.   cup (125 mL) cranberry juice.   cup (125 mL) flavored gelatin (plain or fortified).  1 cup (250 mL) beverage (coffee or tea).  Sugar, if desired. Evening Snack  4 oz (120 mL) strained apple juice (vitamin C-fortified).   cup (125 mL) flavored gelatin (plain or fortified). Document Released: 06/29/2005 Document Revised: 09/21/2011 Document Reviewed: 09/26/2010 [facility] Patient Information 2013 [place], [place]. Viral Gastroenteritis Viral gastroenteritis is also known as stomach flu. This condition affects the stomach and intestinal tract. It can cause sudden diarrhea and vomiting. The illness typically lasts 3 to 8 days. Most people develop an immune response that eventually gets rid of the virus. While this natural response develops, the virus can make you  quite ill. CAUSES  Many different viruses can cause gastroenteritis, such as rotavirus or noroviruses. You can catch one of these viruses by consuming contaminated food or water. You may also catch a virus by sharing utensils or other personal items with an infected person or by touching a contaminated  surface. SYMPTOMS  The most common symptoms are diarrhea and vomiting. These problems can cause a severe loss of body fluids (dehydration) and a body salt (electrolyte) imbalance. Other symptoms may include:  Fever.  Headache.  Fatigue.  Abdominal pain. DIAGNOSIS  Your caregiver can usually diagnose viral gastroenteritis based on your symptoms and a physical exam. A stool sample may also be taken to test for the presence of viruses or other infections. TREATMENT  This illness typically goes away on its own. Treatments are aimed at rehydration. The most serious cases of viral gastroenteritis involve vomiting so severely that you are not able to keep fluids down. In these cases, fluids must be given through an intravenous line (IV). HOME CARE INSTRUCTIONS   Drink enough fluids to keep your urine clear or pale yellow. Drink small amounts of fluids frequently and increase the amounts as tolerated.  Ask your caregiver for specific rehydration instructions.  Avoid:  Foods high in sugar.  Alcohol.  Carbonated drinks.  Tobacco.  Juice.  Caffeine drinks.  Extremely hot or cold fluids.  Fatty, greasy foods.  Too much intake of anything at one time.  Dairy products until 24 to 48 hours after diarrhea stops.  You may consume probiotics. Probiotics are active cultures of beneficial bacteria. They may lessen the amount and number of diarrheal stools in adults. Probiotics can be found in yogurt with active cultures and in supplements.  Wash your hands well to avoid spreading the virus.  Only take over-the-counter or prescription medicines for pain, discomfort, or fever as directed by your caregiver. Do not give aspirin to children. Antidiarrheal medicines are not recommended.  Ask your caregiver if you should continue to take your regular prescribed and over-the-counter medicines.  Keep all follow-up appointments as directed by your caregiver. SEEK IMMEDIATE MEDICAL CARE IF:    You are unable to keep fluids down.  You do not urinate at least once every 6 to 8 hours.  You develop shortness of breath.  You notice blood in your stool or vomit. This may look like coffee grounds.  You have abdominal pain that increases or is concentrated in one small area (localized).  You have persistent vomiting or diarrhea.  You have a fever.  The patient is a child younger than 3 months, and he or she has a fever.  The patient is a child older than 3 months, and he or she has a fever and persistent symptoms.  The patient is a child older than 3 months, and he or she has a fever and symptoms suddenly get worse.  The patient is a baby, and he or she has no tears when crying. MAKE SURE YOU:   Understand these instructions.  Will watch your condition.  Will get help right away if you are not doing well or get worse. Document Released: 06/29/2005 Document Revised: 09/21/2011 Document Reviewed: 04/15/2011 [facility] Patient Information 2013 [place], [place].

## 2012-11-04 ENCOUNTER — Telehealth: Payer: Self-pay

## 2012-11-04 NOTE — Telephone Encounter (Signed)
Pt said she has developed some type of allergie and is wanting to know if there is something she can take even though she has high blood pressure Call back number is [phone]

## 2012-11-04 NOTE — Telephone Encounter (Signed)
She can try Claritin or Zyrtec.

## 2013-01-10 ENCOUNTER — Ambulatory Visit
Admission: RE | Admit: 2013-01-10 | Discharge: 2013-01-10 | Disposition: A | Discharge status: Home / Self Care | Payer: Federal, State, Local not specified - PPO | Source: Ambulatory Visit | Attending: Obstetrics and Gynecology | Admitting: Obstetrics and Gynecology

## 2013-01-10 DIAGNOSIS — Z1231 Encounter for screening mammogram for malignant neoplasm of breast: Secondary | ICD-10-CM

## 2013-01-12 ENCOUNTER — Other Ambulatory Visit: Payer: Self-pay | Admitting: Obstetrics and Gynecology

## 2013-01-12 DIAGNOSIS — R928 Other abnormal and inconclusive findings on diagnostic imaging of breast: Secondary | ICD-10-CM

## 2013-01-30 ENCOUNTER — Ambulatory Visit
Admission: RE | Admit: 2013-01-30 | Discharge: 2013-01-30 | Disposition: A | Discharge status: Home / Self Care | Payer: Federal, State, Local not specified - PPO | Source: Ambulatory Visit | Attending: Obstetrics and Gynecology | Admitting: Obstetrics and Gynecology

## 2013-01-30 DIAGNOSIS — R928 Other abnormal and inconclusive findings on diagnostic imaging of breast: Secondary | ICD-10-CM

## 2013-02-25 ENCOUNTER — Other Ambulatory Visit: Payer: Self-pay | Admitting: Physician Assistant

## 2013-02-27 ENCOUNTER — Other Ambulatory Visit: Payer: Self-pay | Admitting: Physician Assistant

## 2013-03-21 ENCOUNTER — Ambulatory Visit (INDEPENDENT_AMBULATORY_CARE_PROVIDER_SITE_OTHER): Payer: Federal, State, Local not specified - PPO | Admitting: Physician Assistant

## 2013-03-21 ENCOUNTER — Encounter: Payer: Self-pay | Admitting: Physician Assistant

## 2013-03-21 VITALS — BP 134/84 | HR 63 | Temp 97.9°F | Resp 16 | Ht 68.5 in | Wt 256.0 lb

## 2013-03-21 DIAGNOSIS — R51 Headache: Secondary | ICD-10-CM

## 2013-03-21 DIAGNOSIS — I10 Essential (primary) hypertension: Secondary | ICD-10-CM

## 2013-03-21 DIAGNOSIS — E669 Obesity, unspecified: Secondary | ICD-10-CM

## 2013-03-21 DIAGNOSIS — E785 Hyperlipidemia, unspecified: Secondary | ICD-10-CM

## 2013-03-21 LAB — COMPREHENSIVE METABOLIC PANEL
AST: 17 U/L (ref 0–37)
Albumin: 4 g/dL (ref 3.5–5.2)
Alkaline Phosphatase: 74 U/L (ref 39–117)
BUN: 12 mg/dL (ref 6–23)
Glucose, Bld: 81 mg/dL (ref 70–99)
Potassium: 3.7 mEq/L (ref 3.5–5.3)
Sodium: 139 mEq/L (ref 135–145)
Total Bilirubin: 0.6 mg/dL (ref 0.3–1.2)
Total Protein: 7.5 g/dL (ref 6.0–8.3)

## 2013-03-21 LAB — CBC WITH DIFFERENTIAL/PLATELET
Eosinophils Absolute: 0 10*3/uL (ref 0.0–0.7)
Eosinophils Relative: 1 % (ref 0–5)
HCT: 41.4 % (ref 36.0–46.0)
Hemoglobin: 14.1 g/dL (ref 12.0–15.0)
Lymphocytes Relative: 52 % — ABNORMAL HIGH (ref 12–46)
Lymphs Abs: 1.8 10*3/uL (ref 0.7–4.0)
MCH: 30.4 pg (ref 26.0–34.0)
MCV: 89.2 fL (ref 78.0–100.0)
Monocytes Absolute: 0.4 10*3/uL (ref 0.1–1.0)
Monocytes Relative: 12 % (ref 3–12)
RBC: 4.64 MIL/uL (ref 3.87–5.11)
WBC: 3.5 10*3/uL — ABNORMAL LOW (ref 4.0–10.5)

## 2013-03-21 LAB — LIPID PANEL
HDL: 55 mg/dL (ref >39)
Total CHOL/HDL Ratio: 3.4 Ratio
VLDL: 13 mg/dL (ref 0–40)

## 2013-03-21 MED ORDER — PRAVASTATIN SODIUM 20 MG PO TABS: 20.0000 mg | ORAL_TABLET | Freq: Every day | ORAL | Status: DC

## 2013-03-21 MED ORDER — METOPROLOL SUCCINATE ER 100 MG PO TB24: 100.0000 mg | ORAL_TABLET | Freq: Every day | ORAL | Status: DC

## 2013-03-21 MED ORDER — ENALAPRIL MALEATE 10 MG PO TABS: 10.0000 mg | ORAL_TABLET | Freq: Every day | ORAL | Status: DC

## 2013-03-21 NOTE — Patient Instructions (Addendum)
Schedule another visit for your shoulder, since the symptoms began at work.  Bring the paperwork with you, and when you schedule, make sure you tell them it's a Worker's Comp visit.  If you have not heard anything regarding the neurology referral in 1 week, please contact our office.

## 2013-03-21 NOTE — Progress Notes (Signed)
  Subjective:    Patient ID: Kathleen Mata, female    DOB: 06-02-1966, 47 y.o.   MRN: [id number]  HPI  This 47 y.o. female presents for evaluation of HTN.  She notes that while her HA significantly improved initially with getting her BP under control, she's starting having more HA again.  At least weekly, sometimes more often. Usually the pain is behind one of her eyes.  Not associated with nausea or photophobia.  No vision change.  No dizziness or weakness, no paresthesias.  She stopped exercising regularly and has gained weight back. She'd gotten down to 227.  Medications, allergies, past medical history, surgical history, family history, social history and problem list reviewed.  Review of Systems As above. Denies chest pain, shortness of breath, diarrhea, constipation, melena, hematochezia, dysuria, increased urinary urgency or frequency, increased hunger or thirst, unintentional weight change, unexplained myalgias or arthralgias, rash.     Objective:   Physical Exam Blood pressure 134/84, pulse 63, temperature 97.9 F (36.6 C), resp. rate 16, height 5' 8.5" (1.74 m), weight 256 lb (116.121 kg). Body mass index is 38.35 kg/(m^2). Well-developed, well nourished BF who is awake, alert and oriented, in NAD. HEENT: [place]/AT, PERRL, EOMI.  Sclera and conjunctiva are clear.  Funduscopic exam is normal bilaterally. EAC are patent, TMs are normal in appearance. Nasal mucosa is pink and moist. OP is clear. Neck: supple, non-tender, no lymphadenopathy, thyromegaly. Heart: RRR, no murmur Lungs: normal effort, CTA Abdomen: normo-active bowel sounds, supple, non-tender, no mass or organomegaly. Extremities: no cyanosis, clubbing or edema. Neurologic: CN II-XII intact.  Good strength.  Normal DTRs. Normal sensation to light touch. Skin: warm and dry without rash. Psychologic: good mood and appropriate affect, normal speech and behavior.        Assessment & Plan:  Essential hypertension, benign -  Plan: enalapril (VASOTEC) 10 MG tablet, metoprolol succinate (TOPROL-XL) 100 MG 24 hr tablet, CBC with Differential  Headache - Plan: Ambulatory referral to Neurology  Obesity, unspecified - Plan: restart exercise program and intentional eating changes.  Hyperlipidemia - Plan: pravastatin (PRAVACHOL) 20 MG tablet, Lipid panel, Comprehensive metabolic panel  Fernande Bras, PA-C Physician Assistant-Certified [facility]

## 2013-03-22 ENCOUNTER — Encounter: Payer: Self-pay | Admitting: Physician Assistant

## 2013-04-10 ENCOUNTER — Ambulatory Visit (INDEPENDENT_AMBULATORY_CARE_PROVIDER_SITE_OTHER): Payer: Federal, State, Local not specified - PPO | Admitting: Neurology

## 2013-04-10 ENCOUNTER — Encounter: Payer: Self-pay | Admitting: Neurology

## 2013-04-10 VITALS — BP 145/89 | HR 73 | Ht 68.5 in | Wt 263.0 lb

## 2013-04-10 DIAGNOSIS — R51 Headache: Secondary | ICD-10-CM

## 2013-04-10 NOTE — Progress Notes (Signed)
[facility]  Provider:  Dr Hosie Poisson Referring Provider: Carmelina Paddock Primary Care Physician:  JEFFERY,CHELLE, PA-C  CC: headache  HPI:  Kathleen Mata is a 47 y.o. female here as a referral from Dr. Leotis Shames for headache evaluation  Headaches been ongoing for a few years. She initially thought it was related to her elevated blood pressure but that has been well-controlled she continues to have headaches. Occurring 2-3 times per week, last hours to all day. Described as a retro-orbital right-sided L. aching pain. No nausea or vomiting, positive photo phobia but no phonophobia. No dizziness, no vertigo. No focal motor or sensory changes. She does note some difficulty with vision, she feels her vision has been improving progressively worse. No transient obscurations. No aura. Headaches typically occur during the daytime it worse as the day goes on. Headaches are worse today she is at work, does not notice headache when she is off of work. 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 Notes that the overhead lights at work exacerbate her headaches. Denies any other triggers. Has been taking Goody powders 2-3 times a week for the headache. Notes that this gives good benefit. No family history of headaches. Has trouble sleeping at night, wakes up frequently thoughout the night. Has been told she snores.  Has not seen eye doctor in a few years, feels she is due for an eye exam.   Review of Systems: Out of a complete 14 system review, the patient complains of only the following symptoms, and all other reviewed systems are negative. Positive for headache and decreased energy  History   Social History  . Marital Status: Single    Spouse Name: N/A    Number of Children: 1  . Years of Education: 12   Occupational History  .      [employer]   Social History Main Topics  . Smoking status: Never Smoker   . Smokeless tobacco: Never Used  .  Alcohol Use: Yes     Comment: 2-3 drinks daily  . Drug Use: No  . Sexual Activity: Yes    Birth Control/ Protection: None   Other Topics Concern  . Not on file   Social History Narrative   Patient is right handed, consumes caffeine rarely.    Family History  Problem Relation Age of Onset  . Hypertension Mother   . Heart disease Mother   . Hypertension Father   . Kidney disease Father   . Sarcoidosis Brother 67    Past Medical History  Diagnosis Date  . Essential hypertension, benign   . Microalbuminuria   . Vitamin D deficiency   . Obesity, unspecified   . Fibroids   . [id number](782.9)     Past Surgical History  Procedure Laterality Date  . Wisdom tooth extraction    . Endometrial ablation      Current Outpatient Prescriptions  Medication Sig Dispense Refill  . enalapril (VASOTEC) 10 MG tablet Take 1 tablet (10 mg total) by mouth daily.  90 tablet  1  . metoprolol succinate (TOPROL-XL) 100 MG 24 hr tablet Take 1 tablet (100 mg total) by mouth daily.  90 tablet  1  . pravastatin (PRAVACHOL) 20 MG tablet Take 1 tablet (20 mg total) by mouth daily. Take in the evening.  90 tablet  1   No current facility-administered medications for this visit.    Allergies as of 04/10/2013  . (No Known  Allergies)    Vitals: BP 145/89  Pulse 73  Ht 5' 8.5" (1.74 m)  Wt 263 lb (119.296 kg)  BMI 39.4 kg/m2 Last Weight:  Wt Readings from Last 1 Encounters:  04/10/13 263 lb (119.296 kg)   Last Height:   Ht Readings from Last 1 Encounters:  04/10/13 5' 8.5" (1.74 m)     Physical exam: Exam: Gen: NAD, conversant Eyes: anicteric sclerae, moist conjunctivae HENT: Atraumatic, oropharynx clear Neck: Trachea midline; supple,  Lungs: CTA, no wheezing, rales, rhonic                          CV: RRR, no MRG Abdomen: Soft, non-tender;  Extremities: No peripheral edema  Skin: Normal temperature, no rash,  Psych: Appropriate affect, pleasant  Neuro: MS: AA&Ox3,  appropriately interactive, normal affect   Attention: WORLD backwards  Speech: fluent w/o paraphasic error  Memory: good recent and remote recall  CN: PERRL, EOMI no nystagmus, unable to fully visualize optic disc bilat, VFF to FC bilaterally, no ptosis, sensation intact to LT V1-V3 bilat, face symmetric, no weakness, hearing grossly intact, palate elevates symmetrically, shoulder shrug 5/5 bilat,  tongue protrudes midline, no fasiculations noted.  Motor: normal bulk and tone Strength: 5/5  In all extremities  Coord: rapid alternating and point-to-point (FNF, HTS) movements intact.  Reflexes: symmetrical, bilat downgoing toes  Sens: LT intact in all extremities  Gait: posture, stance, stride and arm-swing normal. Tandem gait intact. Able to walk on heels and toes. Romberg absent.   Assessment:  After physical and neurologic examination, review of laboratory studies, imaging, neurophysiology testing and pre-existing records, assessment will be reviewed on the problem list.  Plan:  Treatment plan and additional workup will be reviewed under Problem List.  1) headache   [name] is a pleasant 47 year old woman presenting for initial evaluation of headache. Per the patient she is having ongoing headache for around a year, are worse when she is at work and resolve when she is home. Headaches are described as a dull pain located behind the right eye no associated nausea vomiting, mild photophobia no phonophobia. He does note some vision change, no transient obscurations noted, visual fields appeared full on exam. Unable to fully visualize optic discs on funduscopic exam. Based on history suspect this is likely a headache related to eye fatigue/strain. With body habitus and visual changes would also consider pseudotumor cerebri. Patient will followup with eye doctor for formal eye exam. Would consider LP in the future. Patient instructed to immediately call our office if having sustained  worsening of vision. Started to use ibuprofen as needed for symptomatic relief.

## 2013-04-10 NOTE — Patient Instructions (Addendum)
Overall you are doing fairly well but I do want to suggest a few things today:   As far as your medications are concerned, I would like to suggest you use ibuprofen as needed for your headaches.  Follow up with your eye doctor for a formal eye exam  Follow up as needed. If you have any worsening in your vision, please call us immediately.  My clinical assistant and will answer any of your questions and relay your messages to me and also relay most of my messages to you.   Our phone number is [phone]. We also have an after hours call service for urgent matters and there is a physician on-call for urgent questions. For any emergencies you know to call 911 or go to the nearest emergency room

## 2013-10-31 ENCOUNTER — Other Ambulatory Visit: Payer: Self-pay | Admitting: Physician Assistant

## 2013-12-13 IMAGING — MG MM DIGITAL DIAGNOSTIC LIMITED*L*
3 series · 3 of 3 positions shown · non-contrast
Comparison: 01/28/2007

CLINICAL DATA: The patient returns for evaluation of a possible
area of distortion in the left upper inner quadrant noted on recent
screening study dated 01/10/2013.

[REDACTED] MAMMOGRAM

[L CC (1 of 2)]
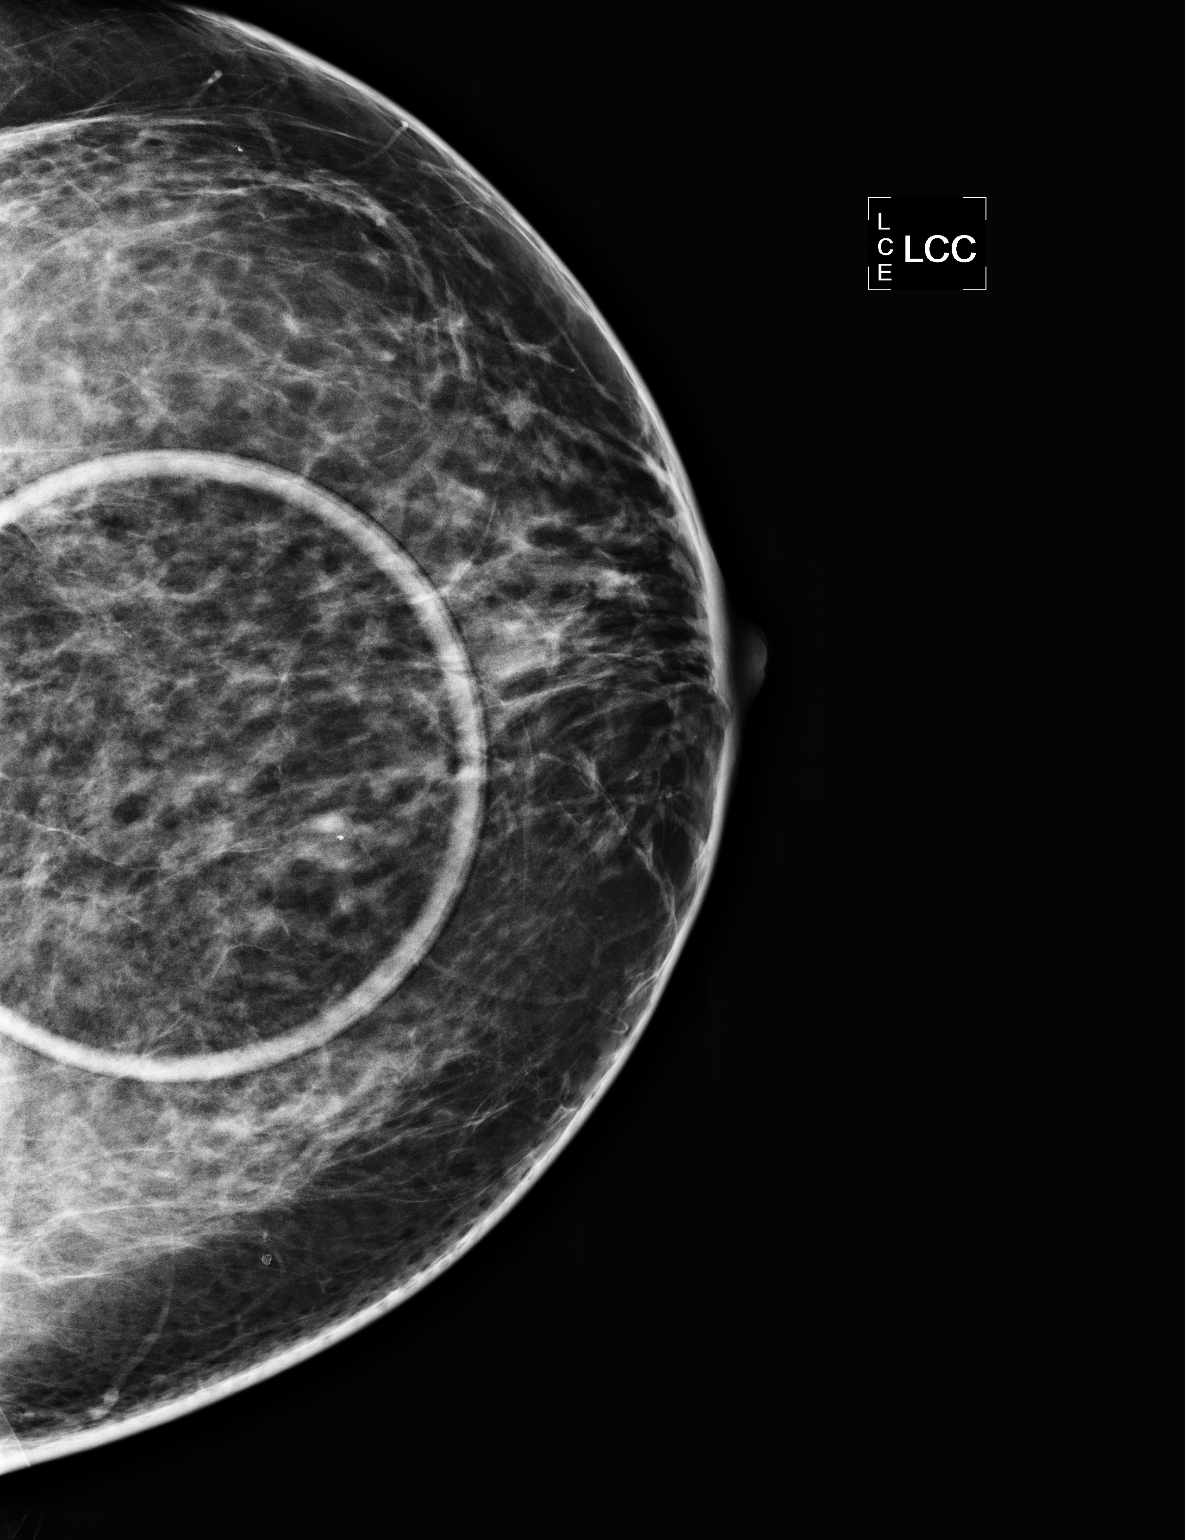

[L MLO]
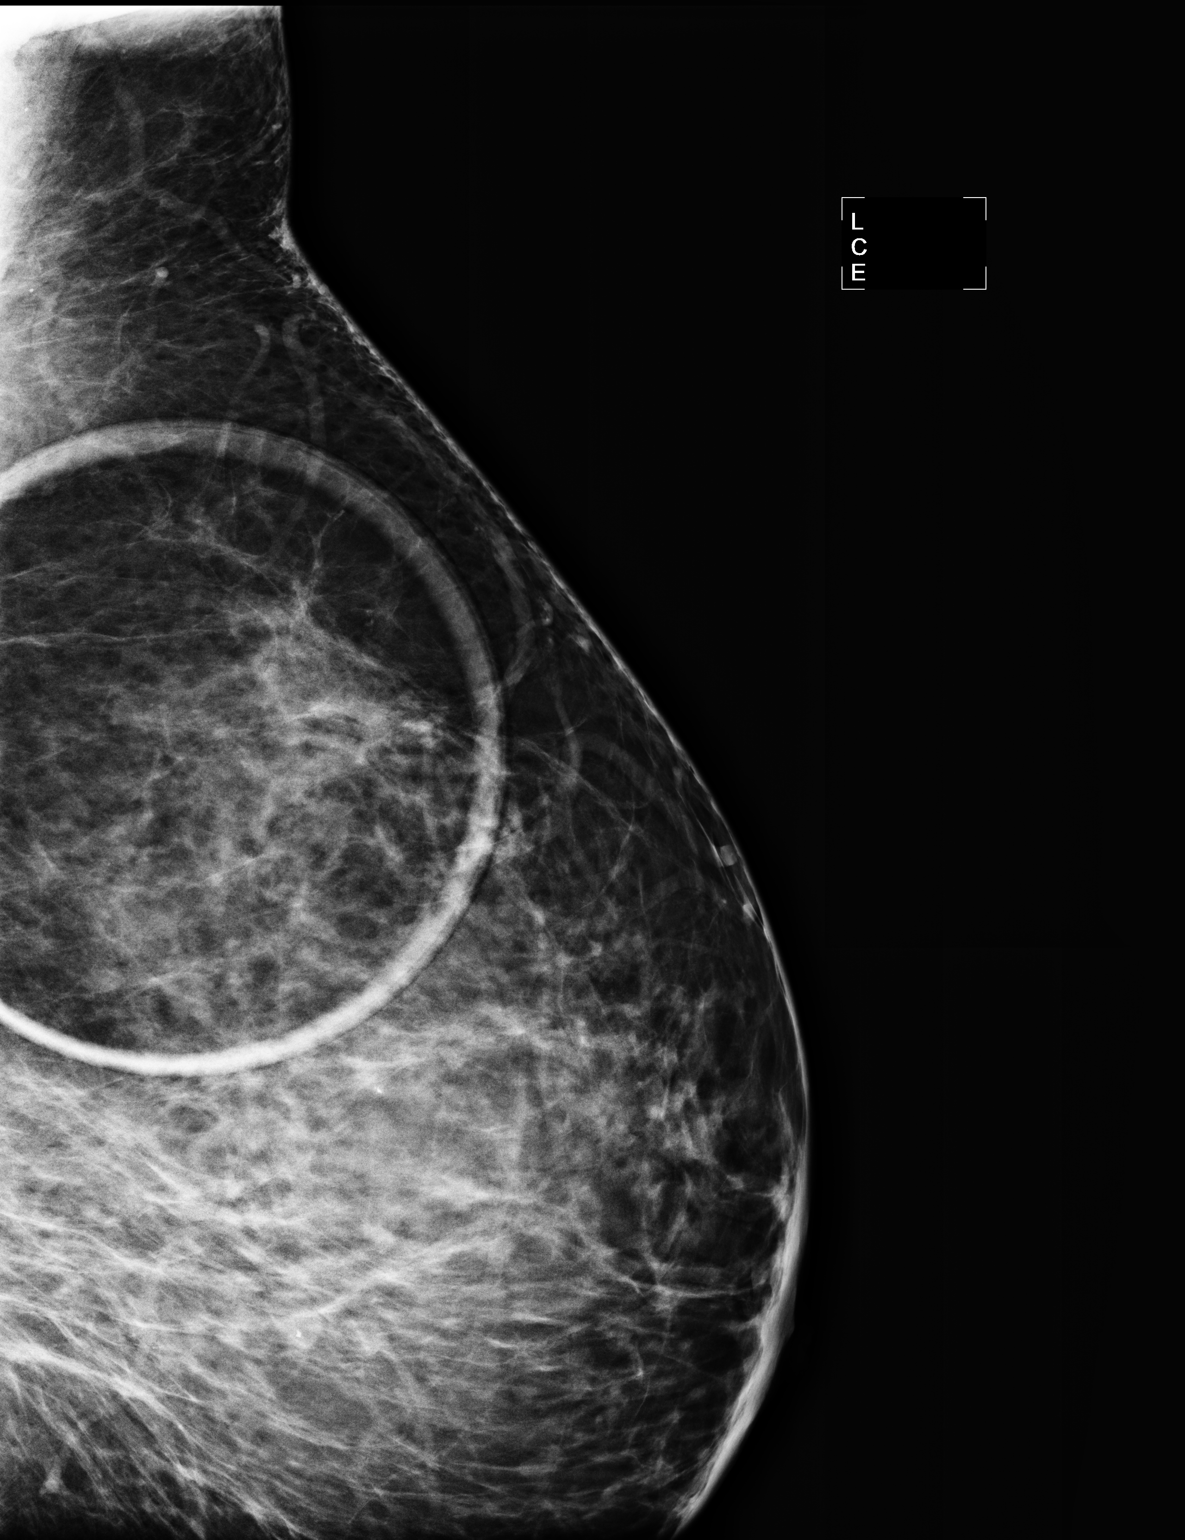

[L CC (2 of 2)]
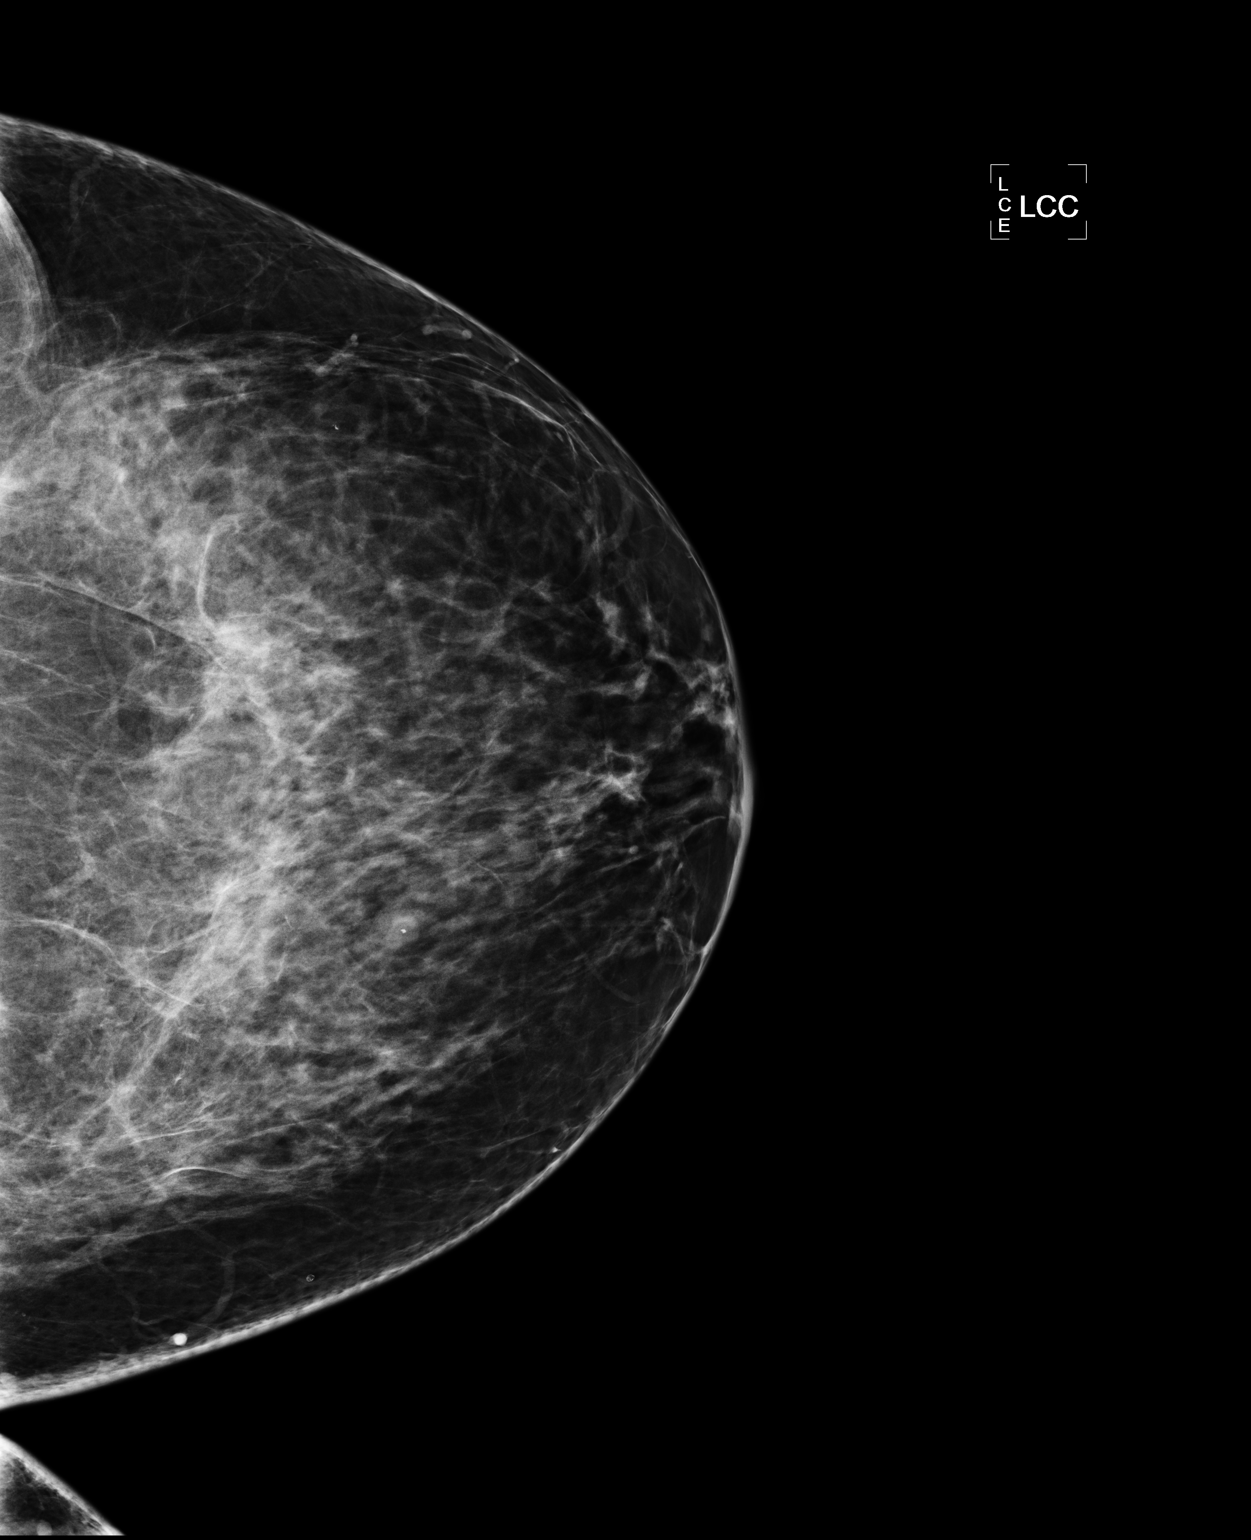

[3 of 3 positions shown; findings below may reference images not displayed]

FINDINGS: ACR Breast Density Category b:  There are scattered areas of
fibroglandular density.

Additional views demonstrate no persistent suspicious mass or
distortion in the upper portion of the left breast.
IMPRESSION: No persistent worrisome abnormality upon additional imaging of the
left breast.

RECOMMENDATION:
Yearly screening mammography is suggested.

I have discussed the findings and recommendations with the patient.
Results were also provided in writing at the conclusion of the
visit.  If applicable, a reminder letter will be sent to the
patient regarding her next appointment.

BI-RADS CATEGORY 1:  Negative.

## 2014-01-23 ENCOUNTER — Encounter: Payer: Self-pay | Admitting: Physician Assistant

## 2014-01-23 ENCOUNTER — Ambulatory Visit (INDEPENDENT_AMBULATORY_CARE_PROVIDER_SITE_OTHER): Payer: Federal, State, Local not specified - PPO | Admitting: Physician Assistant

## 2014-01-23 VITALS — BP 142/88 | HR 64 | Temp 98.3°F | Resp 18 | Wt 250.0 lb

## 2014-01-23 DIAGNOSIS — E559 Vitamin D deficiency, unspecified: Secondary | ICD-10-CM

## 2014-01-23 DIAGNOSIS — E669 Obesity, unspecified: Secondary | ICD-10-CM

## 2014-01-23 DIAGNOSIS — I10 Essential (primary) hypertension: Secondary | ICD-10-CM

## 2014-01-23 DIAGNOSIS — E785 Hyperlipidemia, unspecified: Secondary | ICD-10-CM

## 2014-01-23 DIAGNOSIS — R809 Proteinuria, unspecified: Secondary | ICD-10-CM

## 2014-01-23 LAB — LIPID PANEL
Cholesterol: 182 mg/dL (ref 0–200)
HDL: 56 mg/dL (ref >39)
LDL Cholesterol: 113 mg/dL — ABNORMAL HIGH (ref 0–99)
TRIGLYCERIDES: 65 mg/dL (ref <150)
Total CHOL/HDL Ratio: 3.3 Ratio
VLDL: 13 mg/dL (ref 0–40)

## 2014-01-23 LAB — COMPREHENSIVE METABOLIC PANEL
ALT: 29 U/L (ref 0–35)
AST: 22 U/L (ref 0–37)
Albumin: 4 g/dL (ref 3.5–5.2)
Alkaline Phosphatase: 69 U/L (ref 39–117)
BILIRUBIN TOTAL: 0.6 mg/dL (ref 0.2–1.2)
BUN: 12 mg/dL (ref 6–23)
CO2: 28 mEq/L (ref 19–32)
Calcium: 8.9 mg/dL (ref 8.4–10.5)
Chloride: 107 mEq/L (ref 96–112)
Creat: 0.91 mg/dL (ref 0.50–1.10)
Glucose, Bld: 94 mg/dL (ref 70–99)
Potassium: 4 mEq/L (ref 3.5–5.3)
SODIUM: 142 meq/L (ref 135–145)
Total Protein: 7.6 g/dL (ref 6.0–8.3)

## 2014-01-23 LAB — CBC WITH DIFFERENTIAL/PLATELET
BASOS ABS: 0 10*3/uL (ref 0.0–0.1)
BASOS PCT: 1 % (ref 0–1)
EOS ABS: 0 10*3/uL (ref 0.0–0.7)
Eosinophils Relative: 1 % (ref 0–5)
HCT: 41.8 % (ref 36.0–46.0)
Hemoglobin: 14.2 g/dL (ref 12.0–15.0)
Lymphocytes Relative: 50 % — ABNORMAL HIGH (ref 12–46)
Lymphs Abs: 1.8 10*3/uL (ref 0.7–4.0)
MCH: 30.6 pg (ref 26.0–34.0)
MCHC: 34 g/dL (ref 30.0–36.0)
MCV: 90.1 fL (ref 78.0–100.0)
Monocytes Absolute: 0.4 10*3/uL (ref 0.1–1.0)
Monocytes Relative: 12 % (ref 3–12)
NEUTROS ABS: 1.3 10*3/uL — AB (ref 1.7–7.7)
NEUTROS PCT: 36 % — AB (ref 43–77)
PLATELETS: 266 10*3/uL (ref 150–400)
RBC: 4.64 MIL/uL (ref 3.87–5.11)
RDW: 12.9 % (ref 11.5–15.5)
WBC: 3.6 10*3/uL — ABNORMAL LOW (ref 4.0–10.5)

## 2014-01-23 LAB — TSH: TSH: 0.99 u[IU]/mL (ref 0.350–4.500)

## 2014-01-23 LAB — MICROALBUMIN, URINE: Microalb, Ur: 200.48 mg/dL — ABNORMAL HIGH (ref 0.00–1.89)

## 2014-01-23 LAB — VITAMIN D 25 HYDROXY (VIT D DEFICIENCY, FRACTURES): Vit D, 25-Hydroxy: 15 ng/mL — ABNORMAL LOW (ref 30–89)

## 2014-01-23 MED ORDER — ENALAPRIL MALEATE 10 MG PO TABS: 10.0000 mg | ORAL_TABLET | Freq: Every day | ORAL | Status: DC

## 2014-01-23 MED ORDER — METOPROLOL SUCCINATE ER 100 MG PO TB24: 100.0000 mg | ORAL_TABLET | Freq: Every day | ORAL | Status: DC

## 2014-01-23 MED ORDER — PRAVASTATIN SODIUM 20 MG PO TABS: 20.0000 mg | ORAL_TABLET | Freq: Every day | ORAL | Status: DC

## 2014-01-23 NOTE — Patient Instructions (Signed)
I will contact you with your lab results as soon as they are available.   If you have not heard from me in 2 weeks, please contact me.  The fastest way to get your results is to register for My Chart (see the instructions on the last page of this printout).  Keep up the great work!

## 2014-01-23 NOTE — Progress Notes (Signed)
   Subjective:    Patient ID: Kathleen Mata, female    DOB: 05-30-1966, 48 y.o.   MRN: [id number]   PCP: Cannon Quinton, PA-C  Chief Complaint  Patient presents with  . Hypertension    needs med refills/ pended    Medications, allergies, past medical history, surgical history, family history, social history and problem list reviewed and updated.  HPI  Presents for medication refills.  She's doing well, feeling good.  A coworker has become her exercise buddy and she's been doing more regular physical activity recently.  Review of Systems No chest pain, SOB, HA, dizziness, vision change, N/V, diarrhea, constipation, dysuria, urinary urgency or frequency, myalgias, arthralgias or rash. Notes less tolerance to milk products as she has gotten older.  Eliminating them from her diet has not been difficult.    Objective:   Physical Exam  Vitals reviewed. Constitutional: She is oriented to person, place, and time. Vital signs are normal. She appears well-developed and well-nourished. She is active and cooperative. No distress.  BP 142/88  Pulse 64  Temp(Src) 98.3 F (36.8 C)  Resp 18  Wt 250 lb (113.399 kg)  SpO2 97%  HENT:  Head: Normocephalic and atraumatic.  Right Ear: Hearing normal.  Left Ear: Hearing normal.  Eyes: Conjunctivae are normal. No scleral icterus.  Neck: Normal range of motion. Neck supple. No thyromegaly present.  Cardiovascular: Normal rate, regular rhythm and normal heart sounds.   Pulses:      Radial pulses are 2+ on the right side, and 2+ on the left side.  Pulmonary/Chest: Effort normal and breath sounds normal.  Lymphadenopathy:       Head (right side): No tonsillar, no preauricular, no posterior auricular and no occipital adenopathy present.       Head (left side): No tonsillar, no preauricular, no posterior auricular and no occipital adenopathy present.    She has no cervical adenopathy.       Right: No supraclavicular adenopathy present.       Left: No  supraclavicular adenopathy present.  Neurological: She is alert and oriented to person, place, and time. No sensory deficit.  Skin: Skin is warm, dry and intact. No rash noted. No cyanosis or erythema. Nails show no clubbing.  Psychiatric: She has a normal mood and affect.          Assessment & Plan:  1. Essential hypertension, benign Stable. Continue current treatment.  Encouraged continued exercise and healthy eating. - enalapril (VASOTEC) 10 MG tablet; Take 1 tablet (10 mg total) by mouth daily.  Dispense: 90 tablet; Refill: 1 - metoprolol succinate (TOPROL-XL) 100 MG 24 hr tablet; Take 1 tablet (100 mg total) by mouth daily.  Dispense: 90 tablet; Refill: 1 - CBC with Differential - Comprehensive metabolic panel - TSH  2. Hyperlipidemia Await labs. - pravastatin (PRAVACHOL) 20 MG tablet; Take 1 tablet (20 mg total) by mouth daily. Take in the evening.  Dispense: 90 tablet; Refill: 1 - Lipid panel  3. Microalbuminuria Await labs. - Microalbumin, urine  4. Vitamin D deficiency Await labs. - Vit D  25 hydroxy (rtn osteoporosis monitoring)  5. Obesity, unspecified Encouraged healthy eating and regular exercise.  Return in about 6 months (around 07/26/2014) for blood pressure follow up.  Fara Chute, PA-C Physician Assistant-Certified [facility]

## 2014-03-06 ENCOUNTER — Telehealth: Payer: Self-pay

## 2014-03-06 DIAGNOSIS — Z0271 Encounter for disability determination: Secondary | ICD-10-CM

## 2014-03-06 NOTE — Telephone Encounter (Signed)
Pt's FMLA ppw has been paid for and dropped off in Chelle's box for completion in 5-7 business days ( September 3rd 2015). Once completed please place in disability box located at checkout in the [address] building. Jasmines or myself will then scan this paperwork into Pt's MR , and call pt for pick up.   [place]: [phone]        [phone]

## 2014-03-07 NOTE — Telephone Encounter (Signed)
What is the [facility] request for?  We did not discuss this at all at her visit in July. I do not see a previous form we may have completed.  Could be in her paper chart?  Possibly for migraine HA?  I'll keep the form for now.  Please clarify.

## 2014-03-07 NOTE — Telephone Encounter (Signed)
Forms completed and signed. Previous form not found in [facility] nor paper record. If patient is needing more time off than indicated on this form, she needs follow-up with neurology.

## 2014-03-07 NOTE — Telephone Encounter (Signed)
Pulled paper chart- did not find any FMLA paperwork. Called and talked to pt- she frequently has to leave work for her "high blood pressure headaches". 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 Chelle did fill out the paperwork last time. I have put the chart in Chelle's box.

## 2014-03-08 ENCOUNTER — Encounter: Payer: Self-pay | Admitting: Physician Assistant

## 2014-05-14 ENCOUNTER — Encounter: Payer: Self-pay | Admitting: Physician Assistant

## 2014-07-24 ENCOUNTER — Ambulatory Visit: Payer: Federal, State, Local not specified - PPO | Admitting: Physician Assistant

## 2014-08-13 ENCOUNTER — Other Ambulatory Visit: Payer: Self-pay

## 2014-08-13 DIAGNOSIS — Z1231 Encounter for screening mammogram for malignant neoplasm of breast: Secondary | ICD-10-CM

## 2014-08-14 ENCOUNTER — Encounter: Payer: Self-pay | Admitting: Physician Assistant

## 2014-08-14 ENCOUNTER — Ambulatory Visit (INDEPENDENT_AMBULATORY_CARE_PROVIDER_SITE_OTHER): Payer: Federal, State, Local not specified - PPO | Admitting: Physician Assistant

## 2014-08-14 VITALS — BP 137/86 | HR 66 | Temp 98.2°F | Resp 16 | Ht 68.0 in | Wt 240.0 lb

## 2014-08-14 DIAGNOSIS — I10 Essential (primary) hypertension: Secondary | ICD-10-CM

## 2014-08-14 DIAGNOSIS — E785 Hyperlipidemia, unspecified: Secondary | ICD-10-CM

## 2014-08-14 DIAGNOSIS — E669 Obesity, unspecified: Secondary | ICD-10-CM

## 2014-08-14 DIAGNOSIS — R51 Headache: Secondary | ICD-10-CM

## 2014-08-14 DIAGNOSIS — E559 Vitamin D deficiency, unspecified: Secondary | ICD-10-CM

## 2014-08-14 DIAGNOSIS — R809 Proteinuria, unspecified: Secondary | ICD-10-CM

## 2014-08-14 DIAGNOSIS — R519 Headache, unspecified: Secondary | ICD-10-CM

## 2014-08-14 LAB — CBC WITH DIFFERENTIAL/PLATELET
Basophils Absolute: 0 10*3/uL (ref 0.0–0.1)
Basophils Relative: 0 % (ref 0–1)
EOS ABS: 0 10*3/uL (ref 0.0–0.7)
EOS PCT: 1 % (ref 0–5)
HCT: 41.4 % (ref 36.0–46.0)
HEMOGLOBIN: 13.8 g/dL (ref 12.0–15.0)
Lymphocytes Relative: 43 % (ref 12–46)
Lymphs Abs: 1.8 10*3/uL (ref 0.7–4.0)
MCH: 29.9 pg (ref 26.0–34.0)
MCHC: 33.3 g/dL (ref 30.0–36.0)
MCV: 89.6 fL (ref 78.0–100.0)
MPV: 10.5 fL (ref 8.6–12.4)
Monocytes Absolute: 0.4 10*3/uL (ref 0.1–1.0)
Monocytes Relative: 9 % (ref 3–12)
Neutro Abs: 2 10*3/uL (ref 1.7–7.7)
Neutrophils Relative %: 47 % (ref 43–77)
Platelets: 271 10*3/uL (ref 150–400)
RBC: 4.62 MIL/uL (ref 3.87–5.11)
RDW: 13.4 % (ref 11.5–15.5)
WBC: 4.3 10*3/uL (ref 4.0–10.5)

## 2014-08-14 LAB — LIPID PANEL
CHOLESTEROL: 207 mg/dL — AB (ref 0–200)
HDL: 55 mg/dL (ref >39)
LDL CALC: 136 mg/dL — AB (ref 0–99)
Total CHOL/HDL Ratio: 3.8 Ratio
Triglycerides: 79 mg/dL (ref <150)
VLDL: 16 mg/dL (ref 0–40)

## 2014-08-14 LAB — COMPREHENSIVE METABOLIC PANEL
ALT: 30 U/L (ref 0–35)
AST: 21 U/L (ref 0–37)
Albumin: 3.8 g/dL (ref 3.5–5.2)
Alkaline Phosphatase: 79 U/L (ref 39–117)
BUN: 13 mg/dL (ref 6–23)
CALCIUM: 8.9 mg/dL (ref 8.4–10.5)
CO2: 29 meq/L (ref 19–32)
CREATININE: 0.74 mg/dL (ref 0.50–1.10)
Chloride: 108 mEq/L (ref 96–112)
Glucose, Bld: 88 mg/dL (ref 70–99)
POTASSIUM: 4.4 meq/L (ref 3.5–5.3)
Sodium: 140 mEq/L (ref 135–145)
Total Bilirubin: 0.6 mg/dL (ref 0.2–1.2)
Total Protein: 7.4 g/dL (ref 6.0–8.3)

## 2014-08-14 MED ORDER — PRAVASTATIN SODIUM 20 MG PO TABS: 20.0000 mg | ORAL_TABLET | Freq: Every day | ORAL | Status: DC

## 2014-08-14 MED ORDER — METOPROLOL SUCCINATE ER 100 MG PO TB24: 100.0000 mg | ORAL_TABLET | Freq: Every day | ORAL | Status: DC

## 2014-08-14 MED ORDER — ENALAPRIL MALEATE 10 MG PO TABS: 10.0000 mg | ORAL_TABLET | Freq: Every day | ORAL | Status: DC

## 2014-08-14 NOTE — Patient Instructions (Signed)
Contact your eye specialist to schedule an appointment.

## 2014-08-14 NOTE — Progress Notes (Signed)
Subjective:    Patient ID: Kathleen Mata, female    DOB: 11/30/1965, 49 y.o.   MRN: [id number]   PCP: Eliabeth Shoff, PA-C  Chief Complaint  Patient presents with  . Medication Refill    No Known Allergies  Patient Active Problem List   Diagnosis Date Noted  . Essential hypertension, benign   . Microalbuminuria   . Vitamin D deficiency   . Obesity   . Fibroids   . Headache     Prior to Admission medications   Medication Sig Start Date End Date Taking? Authorizing Provider  enalapril (VASOTEC) 10 MG tablet Take 1 tablet (10 mg total) by mouth daily. 01/23/14  Yes Yajayra Feldt S Erlene Devita, PA-C  metoprolol succinate (TOPROL-XL) 100 MG 24 hr tablet Take 1 tablet (100 mg total) by mouth daily. 01/23/14  Yes Jillana Selph S Deidrea Gaetz, PA-C  pravastatin (PRAVACHOL) 20 MG tablet Take 1 tablet (20 mg total) by mouth daily. Take in the evening. 01/23/14 04/15/15 Yes Fara Chute, PA-C    Medical, Surgical, Family and Social History reviewed and updated.  HPI  Presents for refill of regular medications.  She has HTN with persistent microalbuminuria, Vitamin D deficiency, Hyperlipidemia, obesity and Headache. Good BP control. No longer on vitamin D supplementation. Tolerates pravastatin without difficulty. Working on healthy eating and exercise, but hasn't made a Mata progress as she'd like. Headaches are well controlled on beta blocker.  Some blurred vision. Needs an eye exam. No double vision. To have a mammogram next week. Also needs FMLA forms updated (for headache).   Review of Systems As above. No CP, SOB, dizziness, nausea/vomiting.    Objective:   Physical Exam  Constitutional: She is oriented to person, place, and time. She appears well-developed and well-nourished. She is active and cooperative. No distress.  BP 137/86 mmHg  Pulse 66  Temp(Src) 98.2 F (36.8 C)  Resp 16  Ht 5\' 8"  (1.727 m)  Wt 240 lb (108.863 kg)  BMI 36.50 kg/m2  SpO2 100%   Eyes: Conjunctivae are normal.  No scleral icterus.  Neck: No thyromegaly present.  Cardiovascular: Normal rate, regular rhythm and normal heart sounds.   Pulmonary/Chest: Effort normal and breath sounds normal.  Lymphadenopathy:    She has no cervical adenopathy.  Neurological: She is alert and oriented to person, place, and time.  Skin: Skin is warm and dry.  Psychiatric: She has a normal mood and affect. Her speech is normal and behavior is normal.          Assessment & Plan:  1. Essential hypertension, benign Controlled. Lifestyle changes encouraged. Continue current treatment. - CBC with Differential/Platelet - Comprehensive metabolic panel - metoprolol succinate (TOPROL-XL) 100 MG 24 hr tablet; Take 1 tablet (100 mg total) by mouth daily.  Dispense: 90 tablet; Refill: 1 - enalapril (VASOTEC) 10 MG tablet; Take 1 tablet (10 mg total) by mouth daily.  Dispense: 90 tablet; Refill: 1  2. Microalbuminuria Await labs. If persists, plan increase ACE-I dose. - Microalbumin, urine  3. Obesity Healthy lifestyle changes reviewed and encouraged.  4. Vitamin D deficiency Await lab results. If level has dropped will restart supplementation. - Vitamin D 1,25 dihydroxy  5. Nonintractable episodic headache, unspecified headache type Controlled. Continue beta-blocker.  6. Hyperlipidemia Await lab results. Adjust pravastatin dose if indicated. - Lipid panel - pravastatin (PRAVACHOL) 20 MG tablet; Take 1 tablet (20 mg total) by mouth daily. Take in the evening.  Dispense: 90 tablet; Refill: 1  Return in about 6 months (  around 02/12/2015).  Fara Chute, PA-C Physician Assistant-Certified [facility]

## 2014-08-15 LAB — MICROALBUMIN, URINE: Microalb, Ur: 143.5 mg/dL — ABNORMAL HIGH (ref <2.0)

## 2014-08-17 DIAGNOSIS — E785 Hyperlipidemia, unspecified: Secondary | ICD-10-CM | POA: Insufficient documentation

## 2014-08-17 LAB — VITAMIN D 1,25 DIHYDROXY
VITAMIN D 1, 25 (OH) TOTAL: 67 pg/mL (ref 18–72)
Vitamin D2 1, 25 (OH)2: 13 pg/mL
Vitamin D3 1, 25 (OH)2: 54 pg/mL

## 2014-09-10 ENCOUNTER — Telehealth: Payer: Self-pay | Admitting: Physician Assistant

## 2014-09-10 NOTE — Telephone Encounter (Signed)
Patient left message on disability/FMLA VM. She states that she dropped off FMLA paperwork 2 weeks ago and has not been notified that it was ready. Looked in media and saw that a completed copy of patient's FMLA was scanned on 08/31/2014. Informed patient that she can pick up her forms at her earliest convenience or we can fax them. Patient chose to come in a pick them up. They are waiting for her in the pick up drawer at [address].

## 2014-11-16 ENCOUNTER — Ambulatory Visit (INDEPENDENT_AMBULATORY_CARE_PROVIDER_SITE_OTHER): Payer: Federal, State, Local not specified - PPO | Admitting: Internal Medicine

## 2014-11-16 VITALS — BP 132/80 | HR 77 | Temp 98.2°F | Resp 17 | Ht 69.5 in | Wt 249.0 lb

## 2014-11-16 DIAGNOSIS — A084 Viral intestinal infection, unspecified: Secondary | ICD-10-CM

## 2014-11-16 DIAGNOSIS — R197 Diarrhea, unspecified: Secondary | ICD-10-CM

## 2014-11-16 MED ORDER — ONDANSETRON HCL 8 MG PO TABS: 8.0000 mg | ORAL_TABLET | Freq: Three times a day (TID) | ORAL | Status: DC | PRN

## 2014-11-16 NOTE — Patient Instructions (Addendum)
Food Choices to Help Relieve Diarrhea °When you have diarrhea, the foods you eat and your eating habits are very important. Choosing the right foods and drinks can help relieve diarrhea. Also, because diarrhea can last up to 7 days, you need to replace lost fluids and electrolytes (such as sodium, potassium, and chloride) in order to help prevent dehydration.  °WHAT GENERAL GUIDELINES DO I NEED TO FOLLOW? °· Slowly drink 1 cup (8 oz) of fluid for each episode of diarrhea. If you are getting enough fluid, your urine will be clear or pale yellow. °· Eat starchy foods. Some good choices include white rice, white toast, pasta, low-fiber cereal, baked potatoes (without the skin), saltine crackers, and bagels. °· Avoid large servings of any cooked vegetables. °· Limit fruit to two servings per day. A serving is ½ cup or 1 small piece. °· Choose foods with less than 2 g of fiber per serving. °· Limit fats to less than 8 tsp (38 g) per day. °· Avoid fried foods. °· Eat foods that have probiotics in them. Probiotics can be found in certain dairy products. °· Avoid foods and beverages that may increase the speed at which food moves through the stomach and intestines (gastrointestinal tract). Things to avoid include: °¨ High-fiber foods, such as dried fruit, raw fruits and vegetables, nuts, seeds, and whole grain foods. °¨ Spicy foods and high-fat foods. °¨ Foods and beverages sweetened with high-fructose corn syrup, honey, or sugar alcohols such as xylitol, sorbitol, and mannitol. °WHAT FOODS ARE RECOMMENDED? °Grains °White rice. White, French, or pita breads (fresh or toasted), including plain rolls, buns, or bagels. White pasta. Saltine, soda, or graham crackers. Pretzels. Low-fiber cereal. Cooked cereals made with water (such as cornmeal, farina, or cream cereals). Plain muffins. Matzo. Melba toast. Zwieback.  °Vegetables °Potatoes (without the skin). Strained tomato and vegetable juices. Most well-cooked and canned  vegetables without seeds. Tender lettuce. °Fruits °Cooked or canned applesauce, apricots, cherries, fruit cocktail, grapefruit, peaches, pears, or plums. Fresh bananas, apples without skin, cherries, grapes, cantaloupe, grapefruit, peaches, oranges, or plums.  °Meat and Other Protein Products °Baked or boiled chicken. Eggs. Tofu. Fish. Seafood. Smooth peanut butter. Ground or well-cooked tender beef, ham, veal, lamb, pork, or poultry.  °Dairy °Plain yogurt, kefir, and unsweetened liquid yogurt. Lactose-free milk, buttermilk, or soy milk. Plain hard cheese. °Beverages °Sport drinks. Clear broths. Diluted fruit juices (except prune). Regular, caffeine-free sodas such as ginger ale. Water. Decaffeinated teas. Oral rehydration solutions. Sugar-free beverages not sweetened with sugar alcohols. °Other °Bouillon, broth, or soups made from recommended foods.  °The items listed above may not be a complete list of recommended foods or beverages. Contact your dietitian for more options. °WHAT FOODS ARE NOT RECOMMENDED? °Grains °Whole grain, whole wheat, bran, or rye breads, rolls, pastas, crackers, and cereals. Wild or brown rice. Cereals that contain more than 2 g of fiber per serving. Corn tortillas or taco shells. Cooked or dry oatmeal. Granola. Popcorn. °Vegetables °Raw vegetables. Cabbage, broccoli, Brussels sprouts, artichokes, baked beans, beet greens, corn, kale, legumes, peas, sweet potatoes, and yams. Potato skins. Cooked spinach and cabbage. °Fruits °Dried fruit, including raisins and dates. Raw fruits. Stewed or dried prunes. Fresh apples with skin, apricots, mangoes, pears, raspberries, and strawberries.  °Meat and Other Protein Products °Chunky peanut butter. Nuts and seeds. Beans and lentils. Bacon.  °Dairy °High-fat cheeses. Milk, chocolate milk, and beverages made with milk, such as milk shakes. Cream. Ice cream. °Sweets and Desserts °Sweet rolls, doughnuts, and sweet breads. Pancakes   and waffles. °Fats and  Oils °Butter. Cream sauces. Margarine. Salad oils. Plain salad dressings. Olives. Avocados.  °Beverages °Caffeinated beverages (such as coffee, tea, soda, or energy drinks). Alcoholic beverages. Fruit juices with pulp. Prune juice. Soft drinks sweetened with high-fructose corn syrup or sugar alcohols. °Other °Coconut. Hot sauce. Chili powder. Mayonnaise. Gravy. Cream-based or milk-based soups.  °The items listed above may not be a complete list of foods and beverages to avoid. Contact your dietitian for more information. °WHAT SHOULD I DO IF I BECOME DEHYDRATED? °Diarrhea can sometimes lead to dehydration. Signs of dehydration include dark urine and dry mouth and skin. If you think you are dehydrated, you should rehydrate with an oral rehydration solution. These solutions can be purchased at pharmacies, retail stores, or online.  °Drink ½-1 cup (120-240 mL) of oral rehydration solution each time you have an episode of diarrhea. If drinking this amount makes your diarrhea worse, try drinking smaller amounts more often. For example, drink 1-3 tsp (5-15 mL) every 5-10 minutes.  °A general rule for staying hydrated is to drink 1½-2 L of fluid per day. Talk to your health care provider about the specific amount you should be drinking each day. Drink enough fluids to keep your urine clear or pale yellow. °Document Released: 09/19/2003 Document Revised: 07/04/2013 Document Reviewed: 05/22/2013 °ExitCare® Patient Information ©2015 [employer]. This information is not intended to replace advice given to you by your health care provider. Make sure you discuss any questions you have with your health care provider. °Viral Gastroenteritis °Viral gastroenteritis is also known as stomach flu. This condition affects the stomach and intestinal tract. It can cause sudden diarrhea and vomiting. The illness typically lasts 3 to 8 days. Most people develop an immune response that eventually gets rid of the virus. While this natural  response develops, the virus can make you quite ill. °CAUSES  °Many different viruses can cause gastroenteritis, such as rotavirus or noroviruses. You can catch one of these viruses by consuming contaminated food or water. You may also catch a virus by sharing utensils or other personal items with an infected person or by touching a contaminated surface. °SYMPTOMS  °The most common symptoms are diarrhea and vomiting. These problems can cause a severe loss of body fluids (dehydration) and a body salt (electrolyte) imbalance. Other symptoms may include: °· Fever. °· Headache. °· Fatigue. °· Abdominal pain. °DIAGNOSIS  °Your caregiver can usually diagnose viral gastroenteritis based on your symptoms and a physical exam. A stool sample may also be taken to test for the presence of viruses or other infections. °TREATMENT  °This illness typically goes away on its own. Treatments are aimed at rehydration. The most serious cases of viral gastroenteritis involve vomiting so severely that you are not able to keep fluids down. In these cases, fluids must be given through an intravenous line (IV). °HOME CARE INSTRUCTIONS  °· Drink enough fluids to keep your urine clear or pale yellow. Drink small amounts of fluids frequently and increase the amounts as tolerated. °· Ask your caregiver for specific rehydration instructions. °· Avoid: °¨ Foods high in sugar. °¨ Alcohol. °¨ Carbonated drinks. °¨ Tobacco. °¨ Juice. °¨ Caffeine drinks. °¨ Extremely hot or cold fluids. °¨ Fatty, greasy foods. °¨ Too much intake of anything at one time. °¨ Dairy products until 24 to 48 hours after diarrhea stops. °· You may consume probiotics. Probiotics are active cultures of beneficial bacteria. They may lessen the amount and number of diarrheal stools in adults. Probiotics   can be found in yogurt with active cultures and in supplements. °· Wash your hands well to avoid spreading the virus. °· Only take over-the-counter or prescription medicines for  pain, discomfort, or fever as directed by your caregiver. Do not give aspirin to children. Antidiarrheal medicines are not recommended. °· Ask your caregiver if you should continue to take your regular prescribed and over-the-counter medicines. °· Keep all follow-up appointments as directed by your caregiver. °SEEK IMMEDIATE MEDICAL CARE IF:  °· You are unable to keep fluids down. °· You do not urinate at least once every 6 to 8 hours. °· You develop shortness of breath. °· You notice blood in your stool or vomit. This may look like coffee grounds. °· You have abdominal pain that increases or is concentrated in one small area (localized). °· You have persistent vomiting or diarrhea. °· You have a fever. °· The patient is a child younger than 3 months, and he or she has a fever. °· The patient is a child older than 3 months, and he or she has a fever and persistent symptoms. °· The patient is a child older than 3 months, and he or she has a fever and symptoms suddenly get worse. °· The patient is a baby, and he or she has no tears when crying. °MAKE SURE YOU:  °· Understand these instructions. °· Will watch your condition. °· Will get help right away if you are not doing well or get worse. °Document Released: 06/29/2005 Document Revised: 09/21/2011 Document Reviewed: 04/15/2011 °ExitCare® Patient Information ©2015 [employer]. This information is not intended to replace advice given to you by your health care provider. Make sure you discuss any questions you have with your health care provider. ° °

## 2014-11-16 NOTE — Progress Notes (Signed)
   Subjective:    Patient ID: Kathleen Mata, female    DOB: 1966/01/11, 49 y.o.   MRN: [id number]  HPI  49 year old female here for abdominal pain and diarrhea. Pt started feeling ill yesterday. Pt had diarrhea two times this morning. No fever or chills. Her abdominal pain comes in waves. Pt has had some abdominal cramping. No nausea or vomiting.   Pt also complains of knots on her lower legs. Pt has had the knots for about a year. Pt is not on birth control or taking any hormones. Pt normally goes to Dr. Leo Grosser.   Review of Systems  Constitutional: Negative for fever.  Gastrointestinal: Positive for abdominal pain and diarrhea. Negative for nausea and vomiting.       Objective:   Physical Exam  Constitutional: She is oriented to person, place, and time. She appears well-nourished. No distress.  HENT:  Head: Normocephalic.  Mouth/Throat: Oropharynx is clear and moist.  Eyes: EOM are normal. No scleral icterus.  Cardiovascular: Normal rate, regular rhythm and normal heart sounds.   Pulmonary/Chest: Effort normal and breath sounds normal.  Abdominal: Soft. She exhibits no distension and no mass. There is tenderness. There is no rebound and no guarding.  Neurological: She is alert and oriented to person, place, and time. She exhibits normal muscle tone. Coordination normal.  Psychiatric: She has a normal mood and affect. Her behavior is normal.          Assessment & Plan:  Viral gastoenteritis Brat/Clear liquids

## 2014-11-26 ENCOUNTER — Ambulatory Visit (INDEPENDENT_AMBULATORY_CARE_PROVIDER_SITE_OTHER): Payer: Federal, State, Local not specified - PPO | Admitting: Physician Assistant

## 2014-11-26 VITALS — BP 150/80 | HR 69 | Temp 98.4°F | Resp 16 | Ht 68.0 in | Wt 280.6 lb

## 2014-11-26 DIAGNOSIS — I10 Essential (primary) hypertension: Secondary | ICD-10-CM | POA: Diagnosis not present

## 2014-11-26 DIAGNOSIS — T148 Other injury of unspecified body region: Secondary | ICD-10-CM | POA: Diagnosis not present

## 2014-11-26 DIAGNOSIS — Z1239 Encounter for other screening for malignant neoplasm of breast: Secondary | ICD-10-CM | POA: Diagnosis not present

## 2014-11-26 DIAGNOSIS — W57XXXA Bitten or stung by nonvenomous insect and other nonvenomous arthropods, initial encounter: Secondary | ICD-10-CM | POA: Diagnosis not present

## 2014-11-26 MED ORDER — DOXYCYCLINE HYCLATE 100 MG PO CAPS: 100.0000 mg | ORAL_CAPSULE | Freq: Two times a day (BID) | ORAL | Status: DC

## 2014-11-26 NOTE — Patient Instructions (Addendum)
Please take the medication to completion, and let us know if you have any concerns.    Insect Bite Mosquitoes, flies, fleas, bedbugs, and many other insects can bite. Insect bites are different from insect stings. A sting is when venom is injected into the skin. Some insect bites can transmit infectious diseases. SYMPTOMS  Insect bites usually turn red, swell, and itch for 2 to 4 days. They often go away on their own. TREATMENT  Your caregiver may prescribe antibiotic medicines if a bacterial infection develops in the bite. HOME CARE INSTRUCTIONS  Do not scratch the bite area.  Keep the bite area clean and dry. Wash the bite area thoroughly with soap and water.  Put ice or cool compresses on the bite area.  Put ice in a plastic bag.  Place a towel between your skin and the bag.  Leave the ice on for 20 minutes, 4 times a day for the first 2 to 3 days, or as directed.  You may apply a baking soda paste, cortisone cream, or calamine lotion to the bite area as directed by your caregiver. This can help reduce itching and swelling.  Only take over-the-counter or prescription medicines as directed by your caregiver.  If you are given antibiotics, take them as directed. Finish them even if you start to feel better. You may need a tetanus shot if:  You cannot remember when you had your last tetanus shot.  You have never had a tetanus shot.  The injury broke your skin. If you get a tetanus shot, your arm may swell, get red, and feel warm to the touch. This is common and not a problem. If you need a tetanus shot and you choose not to have one, there is a rare chance of getting tetanus. Sickness from tetanus can be serious. SEEK IMMEDIATE MEDICAL CARE IF:   You have increased pain, redness, or swelling in the bite area.  You see a red line on the skin coming from the bite.  You have a fever.  You have joint pain.  You have a headache or neck pain.  You have unusual  weakness.  You have a rash.  You have chest pain or shortness of breath.  You have abdominal pain, nausea, or vomiting.  You feel unusually tired or sleepy. MAKE SURE YOU:   Understand these instructions.  Will watch your condition.  Will get help right away if you are not doing well or get worse. Document Released: 08/06/2004 Document Revised: 09/21/2011 Document Reviewed: 01/28/2011 [facility] Patient Information 2015 [place], [place]. This information is not intended to replace advice given to you by your health care provider. Make sure you discuss any questions you have with your health care provider.  DASH Eating Plan DASH stands for "Dietary Approaches to Stop Hypertension." The DASH eating plan is a healthy eating plan that has been shown to reduce high blood pressure (hypertension). Additional health benefits may include reducing the risk of type 2 diabetes mellitus, heart disease, and stroke. The DASH eating plan may also help with weight loss. WHAT DO I NEED TO KNOW ABOUT THE DASH EATING PLAN? For the DASH eating plan, you will follow these general guidelines:  Choose foods with a percent daily value for sodium of less than 5% (as listed on the food label).  Use salt-free seasonings or herbs instead of table salt or sea salt.  Check with your health care provider or pharmacist before using salt substitutes.  Eat lower-sodium products, often labeled  as "lower sodium" or "no salt added."  Eat fresh foods.  Eat more vegetables, fruits, and low-fat dairy products.  Choose whole grains. Look for the word "whole" as the first word in the ingredient list.  Choose fish and skinless chicken or [place] more often than red meat. Limit fish, poultry, and meat to 6 oz (170 g) each day.  Limit sweets, desserts, sugars, and sugary drinks.  Choose heart-healthy fats.  Limit cheese to 1 oz (28 g) per day.  Eat more home-cooked food and less restaurant, buffet, and fast  food.  Limit fried foods.  Cook foods using methods other than frying.  Limit canned vegetables. If you do use them, rinse them well to decrease the sodium.  When eating at a restaurant, ask that your food be prepared with less salt, or no salt if possible. WHAT FOODS CAN I EAT? Seek help from a dietitian for individual calorie needs. Grains Whole grain or whole wheat bread. Brown rice. Whole grain or whole wheat pasta. Quinoa, bulgur, and whole grain cereals. Low-sodium cereals. Corn or whole wheat flour tortillas. Whole grain cornbread. Whole grain crackers. Low-sodium crackers. Vegetables Fresh or frozen vegetables (raw, steamed, roasted, or grilled). Low-sodium or reduced-sodium tomato and vegetable juices. Low-sodium or reduced-sodium tomato sauce and paste. Low-sodium or reduced-sodium canned vegetables.  Fruits All fresh, canned (in natural juice), or frozen fruits. Meat and Other Protein Products Ground beef (85% or leaner), grass-fed beef, or beef trimmed of fat. Skinless chicken or [place]. Ground chicken or [place]. Pork trimmed of fat. All fish and seafood. Eggs. Dried beans, peas, or lentils. Unsalted nuts and seeds. Unsalted canned beans. Dairy Low-fat dairy products, such as skim or 1% milk, 2% or reduced-fat cheeses, low-fat ricotta or cottage cheese, or plain low-fat yogurt. Low-sodium or reduced-sodium cheeses. Fats and Oils Tub margarines without trans fats. Light or reduced-fat mayonnaise and salad dressings (reduced sodium). Avocado. Safflower, olive, or canola oils. Natural peanut or almond butter. Other Unsalted popcorn and pretzels. The items listed above may not be a complete list of recommended foods or beverages. Contact your dietitian for more options. WHAT FOODS ARE NOT RECOMMENDED? Grains White bread. White pasta. White rice. Refined cornbread. Bagels and croissants. Crackers that contain trans fat. Vegetables Creamed or fried vegetables. Vegetables in a  cheese sauce. Regular canned vegetables. Regular canned tomato sauce and paste. Regular tomato and vegetable juices. Fruits Dried fruits. Canned fruit in light or heavy syrup. Fruit juice. Meat and Other Protein Products Fatty cuts of meat. Ribs, chicken wings, bacon, sausage, bologna, salami, chitterlings, fatback, hot dogs, bratwurst, and packaged luncheon meats. Salted nuts and seeds. Canned beans with salt. Dairy Whole or 2% milk, cream, half-and-half, and cream cheese. Whole-fat or sweetened yogurt. Full-fat cheeses or blue cheese. Nondairy creamers and whipped toppings. Processed cheese, cheese spreads, or cheese curds. Condiments Onion and garlic salt, seasoned salt, table salt, and sea salt. Canned and packaged gravies. Worcestershire sauce. Tartar sauce. Barbecue sauce. Teriyaki sauce. Soy sauce, including reduced sodium. Steak sauce. Fish sauce. Oyster sauce. Cocktail sauce. Horseradish. Ketchup and mustard. Meat flavorings and tenderizers. Bouillon cubes. Hot sauce. Tabasco sauce. Marinades. Taco seasonings. Relishes. Fats and Oils Butter, stick margarine, lard, shortening, ghee, and bacon fat. Coconut, palm kernel, or palm oils. Regular salad dressings. Other Pickles and olives. Salted popcorn and pretzels. The items listed above may not be a complete list of foods and beverages to avoid. Contact your dietitian for more information. WHERE CAN I FIND MORE INFORMATION? National Heart,  Lung, and Blood Institute: travelstabloid.com Document Released: 06/18/2011 Document Revised: 11/13/2013 Document Reviewed: 05/03/2013 [facility] Patient Information 2015 [place], [place]. This information is not intended to replace advice given to you by your health care provider. Make sure you discuss any questions you have with your health care provider.

## 2014-11-26 NOTE — Progress Notes (Addendum)
[facility] [address] [place] [address] [phone]  Date:  11/26/2014   Name:  Kathleen Mata   DOB:  07/11/1966   MRN:  [id number]  PCP:  JEFFERY,CHELLE, PA-C    Chief Complaint: Possible Spider Bite   History of Present Illness:  Kathleen Mata is a 49 y.o. very pleasant female patient who presents with the following:  Saturday, noticed bump that was pruritic and burns, just above her right breast.  She was outdoors, mowing her lawn this day.  She also notices a knot at her right side.  There is no fever, sob, or dyspnea.  She has no body aches.  Initial site had a small red sneak near what appears to be the origin.  She denies target lesion appearances or darkening.  She states that she has been scratching and she scratched the scab, which prompted a very small amount of clear fluid.   HTN: compliant on medication, and takes at night, no diet restrictions or exercise included in regimen.  Patient Active Problem List   Diagnosis Date Noted  . Hyperlipidemia 08/17/2014  . Essential hypertension, benign   . Microalbuminuria   . Vitamin D deficiency   . Obesity   . Fibroids   . Headache     Past Medical History  Diagnosis Date  . Essential hypertension, benign   . Microalbuminuria   . Vitamin D deficiency   . Obesity, unspecified   . Fibroids   . XBJYNWGN(562.1)     Past Surgical History  Procedure Laterality Date  . Wisdom tooth extraction    . Endometrial ablation      History  Substance Use Topics  . Smoking status: Never Smoker   . Smokeless tobacco: Never Used  . Alcohol Use: Yes     Comment: 2-3 drinks daily    Family History  Problem Relation Age of Onset  . Hypertension Mother   . Heart disease Mother   . Hypertension Father   . Kidney disease Father   . Sarcoidosis Brother 46    No Known Allergies  Medication list has been reviewed and updated.  Current Outpatient Prescriptions on File Prior to Visit  Medication Sig  Dispense Refill  . enalapril (VASOTEC) 10 MG tablet Take 1 tablet (10 mg total) by mouth daily. 90 tablet 1  . metoprolol succinate (TOPROL-XL) 100 MG 24 hr tablet Take 1 tablet (100 mg total) by mouth daily. 90 tablet 1  . pravastatin (PRAVACHOL) 20 MG tablet Take 1 tablet (20 mg total) by mouth daily. Take in the evening. 90 tablet 1  . ondansetron (ZOFRAN) 8 MG tablet Take 1 tablet (8 mg total) by mouth every 8 (eight) hours as needed for nausea or vomiting. (Patient not taking: Reported on 11/26/2014) 20 tablet 0   No current facility-administered medications on file prior to visit.    Review of Systems  Constitutional: Negative for fever and chills.  Respiratory: Negative for shortness of breath.   Cardiovascular: Negative for chest pain, palpitations and leg swelling.  Gastrointestinal: Negative for nausea and vomiting.     Physical Examination: Filed Vitals:   11/26/14 0832  BP: 150/80  Pulse: 69  Temp: 98.4 F (36.9 C)  Resp: 16   Filed Vitals:   11/26/14 0832  Height: 5\' 8"  (1.727 m)  Weight: 280 lb 9.6 oz (127.279 kg)   Body mass index is 42.67 kg/(m^2). Ideal Body Weight: Weight in (lb) to have BMI = 25:  164.1  Physical Exam  Constitutional: She is oriented to person, place, and time. She appears well-developed and well-nourished. No distress.  HENT:  Head: Normocephalic and atraumatic.  Eyes: Pupils are equal, round, and reactive to light.  Neck: No thyromegaly present.  Cardiovascular: Normal rate, regular rhythm and normal heart sounds.  Exam reveals no friction rub.   No murmur heard. Pulmonary/Chest: Effort normal and breath sounds normal. No respiratory distress. She has no wheezes.    Lymphadenopathy:    She has axillary adenopathy.       Right axillary: Lateral (Tender, enlarged  lymphadenopathy lateral to the injured site of the right breast.) adenopathy present.  Neurological: She is alert and oriented to person, place, and time.  Skin: Skin is  warm and dry.  Right breast: Erythematous and mild induration with irregular bordering surrounding a small punctate site with small scab.  There is no central clearing  Mild tenderness.  There is no sign of fluctuance.    Psychiatric: She has a normal mood and affect. Her behavior is normal.     Assessment and Plan: 49 year old female is here today for chief complaint of a bump on her right breast that started 2 days ago.  Most suspicious of an insect bite.  Insect bite - doxycycline (VIBRAMYCIN) 100 MG capsule bid 10 days  Screening for breast cancer  -Mammogram screening ordered  Essential hypertension, benign -Advised patient of DASH diet, and exercise -Alerted PCP, and they will discuss HTN at next fast-approaching visit.  Ivar Drape, PA-C [facility] 5/16/20169:28 AM

## 2014-11-28 ENCOUNTER — Other Ambulatory Visit: Payer: Self-pay | Admitting: Physician Assistant

## 2014-12-05 ENCOUNTER — Other Ambulatory Visit: Payer: Self-pay

## 2014-12-05 DIAGNOSIS — S20161A Insect bite (nonvenomous) of breast, right breast, initial encounter: Secondary | ICD-10-CM

## 2014-12-05 DIAGNOSIS — W57XXXA Bitten or stung by nonvenomous insect and other nonvenomous arthropods, initial encounter: Principal | ICD-10-CM

## 2014-12-15 ENCOUNTER — Ambulatory Visit (INDEPENDENT_AMBULATORY_CARE_PROVIDER_SITE_OTHER): Payer: Federal, State, Local not specified - PPO | Admitting: Emergency Medicine

## 2014-12-15 VITALS — BP 174/106 | HR 84 | Temp 99.1°F | Resp 16 | Ht 69.0 in | Wt 247.0 lb

## 2014-12-15 DIAGNOSIS — R51 Headache: Secondary | ICD-10-CM | POA: Diagnosis not present

## 2014-12-15 DIAGNOSIS — I10 Essential (primary) hypertension: Secondary | ICD-10-CM | POA: Diagnosis not present

## 2014-12-15 DIAGNOSIS — R519 Headache, unspecified: Secondary | ICD-10-CM

## 2014-12-15 MED ORDER — HYDROCHLOROTHIAZIDE 12.5 MG PO CAPS: 12.5000 mg | ORAL_CAPSULE | Freq: Every day | ORAL | Status: DC

## 2014-12-15 MED ORDER — MELOXICAM 7.5 MG PO TABS: ORAL_TABLET | ORAL | Status: DC

## 2014-12-15 NOTE — Progress Notes (Signed)
   Subjective:  This chart was scribed for Nena Jordan, MD by [facility], medical scribe at [facility].The patient was seen in exam room 05 and the patient's care was started at 9:35 AM.   Patient ID: Kathleen Mata, female    DOB: [birth year], 49 y.o.   MRN: [id number] Chief Complaint  Patient presents with  . Headache    today  . Hypertension   HPI HPI Comments: Kathleen Mata is a 49 y.o. female who [facility] complaining of a headache and elevated blood pressure. She rates the headache 5-6/10 and it is localized behind her left eye. She will get a headache once or twice every two months Pt first began developing headaches after she was diagnosed with hypertension 3-4 years ago. She has not taken anything to relieve her headache but was taking BC powder in the past. Taking her medication for blood pressure, she occasionally misses a dose; no recent changes in her diet. Left arm recheck is 158/92 and right arm is 156/94. Pt works at the [employer]. She denies vomiting and vision changes.  Review of Systems  Eyes: Negative for visual disturbance.  Gastrointestinal: Negative for vomiting.  Neurological: Positive for headaches.      Objective:  BP 174/106 mmHg  Pulse 84  Temp(Src) 99.1 F (37.3 C)  Resp 16  Ht 5\' 9"  (1.753 m)  Wt 247 lb (112.038 kg)  BMI 36.46 kg/m2  SpO2 96% Physical Exam  Vitals reviewed. CONSTITUTIONAL: Well developed/well nourished HEAD: Normocephalic/atraumatic EYES: EOMI/PERRL ENMT: Mucous membranes moist NECK: supple no meningeal signs SPINE/BACK:entire spine nontender CV: S1/S2 noted, no murmurs/rubs/gallops noted LUNGS: Lungs are clear to auscultation bilaterally, no apparent distress ABDOMEN: soft, nontender, no rebound or guarding, bowel sounds noted throughout abdomen GU:no cva tenderness NEURO: Pt is awake/alert/appropriate, moves all extremitiesx4.  No facial droop.   EXTREMITIES: pulses  normal/equal, full ROM SKIN: warm, color normal PSYCH: no abnormalities of mood noted, alert and oriented to situation    Assessment & Plan:  1. Headache, unspecified headache type I told her she could discuss with shell the possibility of trying Depakote or Topamax as a preventative but she only has headaches every 2-3 months so I'm not sure she wants to take a daily medication - meloxicam (MOBIC) 7.5 MG tablet; Take 1-2 tablets daily at onset of headache. Do not take the medication every day.  Dispense: 20 tablet; Refill: o  2. Essential hypertension Blood pressure is not at goal. I added HCTZ 12.5 one a day. She has an appointment to see shell in the near future.   I personally performed the services described in this documentation, which was scribed in my presence. The recorded information has been reviewed and is accurate.  Arlyss Queen, MD  [facility]  12/15/2014 9:49 AM

## 2014-12-15 NOTE — Patient Instructions (Signed)

## 2015-01-03 ENCOUNTER — Other Ambulatory Visit: Payer: Federal, State, Local not specified - PPO

## 2015-02-12 ENCOUNTER — Telehealth: Payer: Self-pay

## 2015-02-12 ENCOUNTER — Encounter: Payer: Self-pay | Admitting: Physician Assistant

## 2015-02-12 ENCOUNTER — Ambulatory Visit (INDEPENDENT_AMBULATORY_CARE_PROVIDER_SITE_OTHER): Payer: Federal, State, Local not specified - PPO | Admitting: Physician Assistant

## 2015-02-12 VITALS — BP 152/92 | HR 65 | Temp 98.9°F | Resp 16 | Ht 69.0 in | Wt 250.0 lb

## 2015-02-12 DIAGNOSIS — Z114 Encounter for screening for human immunodeficiency virus [HIV]: Secondary | ICD-10-CM

## 2015-02-12 DIAGNOSIS — R809 Proteinuria, unspecified: Secondary | ICD-10-CM | POA: Diagnosis not present

## 2015-02-12 DIAGNOSIS — I10 Essential (primary) hypertension: Secondary | ICD-10-CM | POA: Diagnosis not present

## 2015-02-12 DIAGNOSIS — E785 Hyperlipidemia, unspecified: Secondary | ICD-10-CM | POA: Diagnosis not present

## 2015-02-12 DIAGNOSIS — R519 Headache, unspecified: Secondary | ICD-10-CM

## 2015-02-12 DIAGNOSIS — R51 Headache: Secondary | ICD-10-CM

## 2015-02-12 NOTE — Patient Instructions (Signed)
Keep up the great work! Don't forget your medicine when you travel!  ;)  Schedule an eye exam at your convenience.

## 2015-02-12 NOTE — Progress Notes (Signed)
Patient ID: Kathleen Mata, female    DOB: 14-Oct-1965, 49 y.o.   MRN: [id number]  PCP: Damonie Ellenwood, PA-C  Subjective:   Chief Complaint  Patient presents with  . Hypertension  . Hyperlipidemia  . vitamin d deficiency  . Headache    HPI Presents for evaluation of HTN and HL.   Pt reports that she was out of town for 3 days last week and forgot to bring her medications with her. Pt states that she has not been exercising regularly or maintaining a healthy diet, but plans to start back soon. Pt states that her HAs have been controlled using the beta blocker. Pt states that the ophthalmologist called her to schedule an appointment, but she has not scheduled one yet. She states she will call today and schedule    Headaches 1-2 times each month. Resolve with NSAIDS.    Review of Systems Constitutional: Negative.  Eyes: Negative for pain and redness.  Respiratory: Negative.  Cardiovascular: Negative.  Gastrointestinal: Negative.  Genitourinary: Negative.  Skin: Negative.  Neurological: Negative.  History if HA, controlled with beta blocker     Patient Active Problem List   Diagnosis Date Noted  . Hyperlipidemia 08/17/2014  . Essential hypertension, benign   . Microalbuminuria   . Vitamin D deficiency   . Obesity   . Fibroids   . Headache      Prior to Admission medications   Medication Sig Start Date End Date Taking? Authorizing Provider  enalapril (VASOTEC) 10 MG tablet Take 1 tablet (10 mg total) by mouth daily. 08/14/14  Yes Vennie Waymire, PA-C  meloxicam (MOBIC) 7.5 MG tablet Take 1-2 tablets daily at onset of headache. Do not take the medication every day. 12/15/14  Yes Darlyne Russian, MD  metoprolol succinate (TOPROL-XL) 100 MG 24 hr tablet Take 1 tablet (100 mg total) by mouth daily. 08/14/14  Yes Jeanise Durfey, PA-C  pravastatin (PRAVACHOL) 20 MG tablet Take 1 tablet (20 mg total) by mouth daily. Take in the evening. 08/14/14 11/04/15 Yes Rand Boller, PA-C    hydrochlorothiazide (MICROZIDE) 12.5 MG capsule Take 1 capsule (12.5 mg total) by mouth daily. Patient not taking: Reported on 02/12/2015 12/15/14   Darlyne Russian, MD     No Known Allergies     Objective:  Physical Exam  Constitutional: She is oriented to person, place, and time. Vital signs are normal. She appears well-developed and well-nourished. She is active and cooperative. No distress.  BP 152/92 mmHg  Pulse 65  Temp(Src) 98.9 F (37.2 C) (Oral)  Resp 16  Ht 5\' 9"  (1.753 m)  Wt 250 lb (113.399 kg)  BMI 36.90 kg/m2  SpO2 98%  HENT:  Head: Normocephalic and atraumatic.  Right Ear: Hearing normal.  Left Ear: Hearing normal.  Eyes: Conjunctivae are normal. No scleral icterus.  Neck: Normal range of motion. Neck supple. No thyromegaly present.  Cardiovascular: Normal rate, regular rhythm and normal heart sounds.   Pulses:      Radial pulses are 2+ on the right side, and 2+ on the left side.  Pulmonary/Chest: Effort normal and breath sounds normal.  Lymphadenopathy:       Head (right side): No tonsillar, no preauricular, no posterior auricular and no occipital adenopathy present.       Head (left side): No tonsillar, no preauricular, no posterior auricular and no occipital adenopathy present.    She has no cervical adenopathy.       Right: No supraclavicular adenopathy present.  Left: No supraclavicular adenopathy present.  Neurological: She is alert and oriented to person, place, and time. No sensory deficit.  Skin: Skin is warm, dry and intact. No rash noted. No cyanosis or erythema. Nails show no clubbing.  Psychiatric: She has a normal mood and affect. Her speech is normal and behavior is normal.           Assessment & Plan:   1. Essential hypertension Above goal today. Not taking HCTZ added at her visit 12/15/2014. Advise that she restart it. - Comprehensive metabolic panel  2. Microalbuminuria Await lab results. - Microalbumin, urine  3.  Hyperlipidemia Await lab results. - Lipid panel  4. Headache, unspecified headache type Controlled.  5. Screening for HIV (human immunodeficiency virus) - HIV antibody  Return in about 4 months (around 06/14/2015) for re-evaluation blood pressure.   Fara Chute, PA-C Physician Assistant-Certified [facility]

## 2015-02-12 NOTE — Progress Notes (Signed)
Subjective:    Patient ID: Kathleen Mata, female    DOB: 15-Jul-1965, 49 y.o.   MRN: [id number] HPI Pt presents for 6 month FU of HTN and HL. Pt reports that she was out of town for 3 days last week and forgot to bring her medications with her. Pt states that she has not been exercising regularly or maintaining a healthy diet, but plans to start back soon. Pt states that her HAs have been controlled using the beta blocker. Pt states that the ophthalmologist called her to schedule an appointment, but she has not scheduled one yet. She states she will call today and schedule an appointment for an eye exam.  Review of Systems  Constitutional: Negative.   Eyes: Negative for pain and redness.  Respiratory: Negative.   Cardiovascular: Negative.   Gastrointestinal: Negative.   Genitourinary: Negative.   Skin: Negative.   Neurological: Negative.        History if HA, controlled with beta blocker        Objective:   Physical Exam  Constitutional: She is oriented to person, place, and time. She appears well-developed and well-nourished. No distress.  Neck: Normal range of motion. Neck supple. No tracheal deviation present. No thyromegaly present.  Cardiovascular: Normal rate, regular rhythm and normal heart sounds.  Exam reveals no gallop and no friction rub.   No murmur heard. Pulmonary/Chest: Effort normal and breath sounds normal. No respiratory distress. She has no wheezes. She has no rales. She exhibits no tenderness.  Neurological: She is alert and oriented to person, place, and time.  Skin: Skin is warm and dry. No rash noted. She is not diaphoretic. No erythema. No pallor.  Psychiatric: She has a normal mood and affect. Her behavior is normal. Judgment and thought content normal.       Results for orders placed or performed in visit on 08/14/14  CBC with Differential/Platelet  Result Value Ref Range   WBC 4.3 4.0 - 10.5 K/uL   RBC 4.62 3.87 - 5.11 MIL/uL   Hemoglobin 13.8 12.0 -  15.0 g/dL   HCT 41.4 36.0 - 46.0 %   MCV 89.6 78.0 - 100.0 fL   MCH 29.9 26.0 - 34.0 pg   MCHC 33.3 30.0 - 36.0 g/dL   RDW 13.4 11.5 - 15.5 %   Platelets 271 150 - 400 K/uL   MPV 10.5 8.6 - 12.4 fL   Neutrophils Relative % 47 43 - 77 %   Neutro Abs 2.0 1.7 - 7.7 K/uL   Lymphocytes Relative 43 12 - 46 %   Lymphs Abs 1.8 0.7 - 4.0 K/uL   Monocytes Relative 9 3 - 12 %   Monocytes Absolute 0.4 0.1 - 1.0 K/uL   Eosinophils Relative 1 0 - 5 %   Eosinophils Absolute 0.0 0.0 - 0.7 K/uL   Basophils Relative 0 0 - 1 %   Basophils Absolute 0.0 0.0 - 0.1 K/uL   Smear Review Criteria for review not met   Comprehensive metabolic panel  Result Value Ref Range   Sodium 140 135 - 145 mEq/L   Potassium 4.4 3.5 - 5.3 mEq/L   Chloride 108 96 - 112 mEq/L   CO2 29 19 - 32 mEq/L   Glucose, Bld 88 70 - 99 mg/dL   BUN 13 6 - 23 mg/dL   Creat 0.74 0.50 - 1.10 mg/dL   Total Bilirubin 0.6 0.2 - 1.2 mg/dL   Alkaline Phosphatase 79 39 - 117 U/L  AST 21 0 - 37 U/L   ALT 30 0 - 35 U/L   Total Protein 7.4 6.0 - 8.3 g/dL   Albumin 3.8 3.5 - 5.2 g/dL   Calcium 8.9 8.4 - 10.5 mg/dL  Lipid panel  Result Value Ref Range   Cholesterol 207 (H) 0 - 200 mg/dL   Triglycerides 79 <150 mg/dL   HDL 55 >39 mg/dL   Total CHOL/HDL Ratio 3.8 Ratio   VLDL 16 0 - 40 mg/dL   LDL Cholesterol 136 (H) 0 - 99 mg/dL  Vitamin D 1,25 dihydroxy  Result Value Ref Range   Vitamin D 1, 25 (OH)2 Total 67 18 - 72 pg/mL   Vitamin D3 1, 25 (OH)2 54 pg/mL   Vitamin D2 1, 25 (OH)2 13 pg/mL  Microalbumin, urine  Result Value Ref Range   Microalb, Ur 143.5 (H) <2.0 mg/dL    Assessment & Plan:  1. Essential hypertension - Comprehensive metabolic panel - enalapril (VASOTEC) 10 mg tablet; take one tablet PO daily - HCTZ (MICROZIDE) 12.5 mg capsule; take one capsule PO daily. (pt not taking?)   2. Microalbuminuria - Microalbumin, urine  3. Hyperlipidemia - Lipid panel - Pravastatin (PRAVACHOL) 20 mg tablet; take one tablet PO  daily  4. Headache, unspecified headache type - metorolol succinate (TOPROL-XL) 100 mg 24h tablet; take one tablet PO daily - meloxicam (MOBIC) 7.5 mg tablet; take 1-2 tablets daily at onset of HA, Do not take every day. (abortive tx)  5. Screening for HIV (human immunodeficiency virus) - HIV antibody

## 2015-02-12 NOTE — Telephone Encounter (Signed)
Patient was seen today and forgot to get a doctors note. She needs a note to return to work on Friday August 5th. Patient will be sending her sone to pick it up. Her son's name is Chales Abrahams

## 2015-02-13 LAB — MICROALBUMIN, URINE: Microalb, Ur: 138.2 mg/dL — ABNORMAL HIGH (ref <2.0)

## 2015-02-13 NOTE — Telephone Encounter (Signed)
I don't understand why she'd need a note for Tuesday-Thursday. Please clarify.

## 2015-02-13 NOTE — Telephone Encounter (Signed)
Left VM to call back for clarification. 

## 2015-02-13 NOTE — Telephone Encounter (Signed)
Ok to take out all week??

## 2015-02-21 ENCOUNTER — Telehealth: Payer: Self-pay | Admitting: Family Medicine

## 2015-02-21 NOTE — Addendum Note (Signed)
Addended by: Yvette Rack on: 02/21/2015 01:34 PM   Modules accepted: Orders

## 2015-02-21 NOTE — Telephone Encounter (Signed)
Called patient to see if she was going to come into the appt center to get blood drawn she had left on her appt date with Chelle without getting blood drawn she will need a CMP LIPID HIV/ E78.5 Z11.4 I10

## 2015-04-30 ENCOUNTER — Telehealth: Payer: Self-pay | Admitting: Family Medicine

## 2015-04-30 NOTE — Telephone Encounter (Signed)
No answer patient new appt time has been changed on 06/25/15 from 11:30 to 1:00

## 2015-06-25 ENCOUNTER — Encounter: Payer: Self-pay | Admitting: Physician Assistant

## 2015-06-25 ENCOUNTER — Ambulatory Visit (INDEPENDENT_AMBULATORY_CARE_PROVIDER_SITE_OTHER): Payer: Federal, State, Local not specified - PPO | Admitting: Physician Assistant

## 2015-06-25 VITALS — BP 150/96 | HR 80 | Temp 98.8°F | Resp 16 | Ht 68.0 in | Wt 252.4 lb

## 2015-06-25 DIAGNOSIS — I10 Essential (primary) hypertension: Secondary | ICD-10-CM

## 2015-06-25 DIAGNOSIS — E559 Vitamin D deficiency, unspecified: Secondary | ICD-10-CM | POA: Diagnosis not present

## 2015-06-25 DIAGNOSIS — R809 Proteinuria, unspecified: Secondary | ICD-10-CM

## 2015-06-25 DIAGNOSIS — E785 Hyperlipidemia, unspecified: Secondary | ICD-10-CM

## 2015-06-25 DIAGNOSIS — D72829 Elevated white blood cell count, unspecified: Secondary | ICD-10-CM

## 2015-06-25 DIAGNOSIS — Z114 Encounter for screening for human immunodeficiency virus [HIV]: Secondary | ICD-10-CM

## 2015-06-25 LAB — CBC WITH DIFFERENTIAL/PLATELET
Basophils Absolute: 0 10*3/uL (ref 0.0–0.1)
Basophils Relative: 1 % (ref 0–1)
EOS PCT: 1 % (ref 0–5)
Eosinophils Absolute: 0 10*3/uL (ref 0.0–0.7)
HEMATOCRIT: 40.4 % (ref 36.0–46.0)
Hemoglobin: 14 g/dL (ref 12.0–15.0)
LYMPHS ABS: 2 10*3/uL (ref 0.7–4.0)
LYMPHS PCT: 52 % — AB (ref 12–46)
MCH: 30.7 pg (ref 26.0–34.0)
MCHC: 34.7 g/dL (ref 30.0–36.0)
MCV: 88.6 fL (ref 78.0–100.0)
MONO ABS: 0.5 10*3/uL (ref 0.1–1.0)
MONOS PCT: 12 % (ref 3–12)
MPV: 9.8 fL (ref 8.6–12.4)
Neutro Abs: 1.3 10*3/uL — ABNORMAL LOW (ref 1.7–7.7)
Neutrophils Relative %: 34 % — ABNORMAL LOW (ref 43–77)
Platelets: 273 10*3/uL (ref 150–400)
RBC: 4.56 MIL/uL (ref 3.87–5.11)
RDW: 13.6 % (ref 11.5–15.5)
WBC: 3.9 10*3/uL — ABNORMAL LOW (ref 4.0–10.5)

## 2015-06-25 LAB — LIPID PANEL
Cholesterol: 181 mg/dL (ref 125–200)
HDL: 50 mg/dL (ref >=46)
LDL Cholesterol: 117 mg/dL (ref <130)
TRIGLYCERIDES: 68 mg/dL (ref <150)
Total CHOL/HDL Ratio: 3.6 Ratio (ref <=5.0)
VLDL: 14 mg/dL (ref <30)

## 2015-06-25 LAB — COMPREHENSIVE METABOLIC PANEL
ALK PHOS: 73 U/L (ref 33–115)
ALT: 20 U/L (ref 6–29)
AST: 18 U/L (ref 10–35)
Albumin: 4 g/dL (ref 3.6–5.1)
BUN: 13 mg/dL (ref 7–25)
CO2: 24 mmol/L (ref 20–31)
Calcium: 9 mg/dL (ref 8.6–10.2)
Chloride: 104 mmol/L (ref 98–110)
Creat: 0.9 mg/dL (ref 0.50–1.10)
GLUCOSE: 84 mg/dL (ref 65–99)
Potassium: 3.7 mmol/L (ref 3.5–5.3)
Sodium: 140 mmol/L (ref 135–146)
Total Bilirubin: 0.6 mg/dL (ref 0.2–1.2)
Total Protein: 7.6 g/dL (ref 6.1–8.1)

## 2015-06-25 LAB — TSH: TSH: 1.274 u[IU]/mL (ref 0.350–4.500)

## 2015-06-25 NOTE — Progress Notes (Signed)
Patient ID: Kathleen Mata, female    DOB: 06-16-1966, 49 y.o.   MRN: [id number]  PCP: Sumaiyah Markert, PA-C  Subjective:   Chief Complaint  Patient presents with  . Follow-up  . Hypertension    HPI Presents for evaluation of HTN and hyperlipidemia.  She is fasting today.  Feels well in general. 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  Tolerating medications without adverse effects. Headaches are controlled as long as she takes her medications regularly.  Has a home BP cuff, but doesn't check regularly.  Review of Systems  Constitutional: Negative.   HENT: Negative for congestion, rhinorrhea, sinus pressure, sore throat and voice change.   Eyes: Negative.   Respiratory: Negative.   Cardiovascular: Negative.   Gastrointestinal: Positive for diarrhea (for a few days. Son also has this.). Negative for nausea, vomiting, abdominal pain, constipation and blood in stool.  Genitourinary: Negative.   Musculoskeletal: Negative.   Skin: Negative.   Neurological: Positive for headaches ("they've been ok, " if forgeots meds, she does have mild HA the next day). Negative for dizziness, syncope, speech difficulty, weakness, light-headedness and numbness.  Hematological: Negative for adenopathy.  Psychiatric/Behavioral: Negative.        Patient Active Problem List   Diagnosis Date Noted  . Hyperlipidemia 08/17/2014  . Essential hypertension, benign   . Microalbuminuria   . Vitamin D deficiency   . Obesity   . Fibroids   . Headache      Prior to Admission medications   Medication Sig Start Date End Date Taking? Authorizing Provider  enalapril (VASOTEC) 10 MG tablet Take 1 tablet (10 mg total) by mouth daily. 08/14/14  Yes Alias Villagran, PA-C  hydrochlorothiazide (MICROZIDE) 12.5 MG capsule Take 1 capsule (12.5 mg total) by mouth daily. 12/15/14  Yes Darlyne Russian, MD  meloxicam (MOBIC) 7.5 MG  tablet Take 1-2 tablets daily at onset of headache. Do not take the medication every day. 12/15/14  Yes Darlyne Russian, MD  metoprolol succinate (TOPROL-XL) 100 MG 24 hr tablet Take 1 tablet (100 mg total) by mouth daily. 08/14/14  Yes Hula Tasso, PA-C  pravastatin (PRAVACHOL) 20 MG tablet Take 1 tablet (20 mg total) by mouth daily. Take in the evening. 08/14/14 11/04/15 Yes Leovanni Bjorkman, PA-C     No Known Allergies     Objective:  Physical Exam  Constitutional: She is oriented to person, place, and time. Vital signs are normal. She appears well-developed and well-nourished. She is active and cooperative. No distress.  BP 150/96 mmHg  Pulse 80  Temp(Src) 98.8 F (37.1 C) (Oral)  Resp 16  Ht 5\' 8"  (1.727 m)  Wt 252 lb 6.4 oz (114.488 kg)  BMI 38.39 kg/m2  SpO2 98%  HENT:  Head: Normocephalic and atraumatic.  Right Ear: Hearing normal.  Left Ear: Hearing normal.  Eyes: Conjunctivae are normal. No scleral icterus.  Neck: Normal range of motion. Neck supple. No thyromegaly present.  Cardiovascular: Normal rate, regular rhythm and normal heart sounds.   Pulses:      Radial pulses are 2+ on the right side, and 2+ on the left side.  Pulmonary/Chest: Effort normal and breath sounds normal.  Lymphadenopathy:       Head (right side): No tonsillar, no preauricular, no posterior auricular and no occipital adenopathy present.       Head (left side): No tonsillar, no preauricular, no posterior auricular and  no occipital adenopathy present.    She has no cervical adenopathy.       Right: No supraclavicular adenopathy present.       Left: No supraclavicular adenopathy present.  Neurological: She is alert and oriented to person, place, and time. No sensory deficit.  Skin: Skin is warm, dry and intact. No rash noted. No cyanosis or erythema. Nails show no clubbing.  Psychiatric: She has a normal mood and affect. Her behavior is normal.           Assessment & Plan:   1. Essential  hypertension, benign Above goal. She will start checking her BP at home weekly and recording the results for my review at her next visit. If she's consistently >140/90, with adjust her regimen. - CBC with Differential/Platelet - Comprehensive metabolic panel - TSH  2. Hyperlipidemia Await lab results. Healthy lifestyle changes. Continue statin. - Comprehensive metabolic panel -Lipids  3. Microalbuminuria Await lab results. Maximize BP control. - Microalbumin, urine  4. Vitamin D deficiency Await lab results. - VITAMIN D 25 Hydroxy (Vit-D Deficiency, Fractures)  5. Screening for HIV (human immunodeficiency virus) -HIV   Return in about 6 months (around 12/24/2015) for re-evaluation.    Fara Chute, PA-C Physician Assistant-Certified [facility]

## 2015-06-25 NOTE — Patient Instructions (Signed)
Check your BP at home once each week. Record the results, and bring them with you at your next visit. If it's consistently >140/90, we'll need to step things up.

## 2015-06-26 DIAGNOSIS — D72829 Elevated white blood cell count, unspecified: Secondary | ICD-10-CM | POA: Insufficient documentation

## 2015-06-26 LAB — MICROALBUMIN, URINE: MICROALB UR: 226.7 mg/dL

## 2015-06-26 LAB — HIV ANTIBODY (ROUTINE TESTING W REFLEX): HIV 1&2 Ab, 4th Generation: NONREACTIVE

## 2015-06-26 LAB — VITAMIN D 25 HYDROXY (VIT D DEFICIENCY, FRACTURES): VIT D 25 HYDROXY: 13 ng/mL — AB (ref 30–100)

## 2015-06-26 MED ORDER — VITAMIN D (ERGOCALCIFEROL) 1.25 MG (50000 UNIT) PO CAPS: 50000.0000 [IU] | ORAL_CAPSULE | ORAL | Status: DC

## 2015-06-26 NOTE — Addendum Note (Signed)
Addended by: Fara Chute on: 06/26/2015 03:24 PM   Modules accepted: Orders

## 2015-10-29 ENCOUNTER — Ambulatory Visit (INDEPENDENT_AMBULATORY_CARE_PROVIDER_SITE_OTHER): Payer: Federal, State, Local not specified - PPO | Admitting: Physician Assistant

## 2015-10-29 ENCOUNTER — Encounter: Payer: Self-pay | Admitting: Physician Assistant

## 2015-10-29 VITALS — BP 163/88 | HR 64 | Temp 99.0°F | Resp 16 | Ht 68.0 in | Wt 251.0 lb

## 2015-10-29 DIAGNOSIS — E785 Hyperlipidemia, unspecified: Secondary | ICD-10-CM | POA: Diagnosis not present

## 2015-10-29 DIAGNOSIS — I10 Essential (primary) hypertension: Secondary | ICD-10-CM

## 2015-10-29 DIAGNOSIS — R51 Headache: Secondary | ICD-10-CM | POA: Diagnosis not present

## 2015-10-29 DIAGNOSIS — E559 Vitamin D deficiency, unspecified: Secondary | ICD-10-CM

## 2015-10-29 DIAGNOSIS — R519 Headache, unspecified: Secondary | ICD-10-CM

## 2015-10-29 MED ORDER — VITAMIN D (ERGOCALCIFEROL) 1.25 MG (50000 UNIT) PO CAPS: 50000.0000 [IU] | ORAL_CAPSULE | ORAL | Status: DC

## 2015-10-29 MED ORDER — PRAVASTATIN SODIUM 20 MG PO TABS: 20.0000 mg | ORAL_TABLET | Freq: Every day | ORAL | Status: DC

## 2015-10-29 MED ORDER — ENALAPRIL MALEATE 10 MG PO TABS: 10.0000 mg | ORAL_TABLET | Freq: Every day | ORAL | Status: DC

## 2015-10-29 MED ORDER — HYDROCHLOROTHIAZIDE 12.5 MG PO CAPS: 12.5000 mg | ORAL_CAPSULE | Freq: Every day | ORAL | Status: DC

## 2015-10-29 MED ORDER — MELOXICAM 7.5 MG PO TABS: ORAL_TABLET | ORAL | Status: DC

## 2015-10-29 MED ORDER — METOPROLOL SUCCINATE ER 100 MG PO TB24: 100.0000 mg | ORAL_TABLET | Freq: Every day | ORAL | Status: DC

## 2015-10-29 NOTE — Progress Notes (Signed)
Patient ID: Kathleen Mata, female    DOB: 02-Nov-1965, 50 y.o.   MRN: [id number]  PCP: Kevina Piloto, PA-C  Subjective:   Chief Complaint  Patient presents with  . Follow-up  . Hypertension    HPI Presents for evaluation of HTN.  Overall is doing well. No adverse effects from her medications. Was checking her BP at home after her last visit, but then stopped, and doesn't remember what the readings were. Did not take her medications (enalapril and Toprol XL) two days in a row several days ago, but did take a dose today at 12 midnight. Usually takes it about 10 pm before she goes to bed. Takes HCTZ in the am.   Review of Systems  Constitutional: Negative for activity change, appetite change, fatigue and unexpected weight change.  HENT: Negative for congestion, dental problem, ear pain, hearing loss, mouth sores, postnasal drip, rhinorrhea, sneezing, sore throat, tinnitus and trouble swallowing.   Eyes: Negative for photophobia, pain, redness and visual disturbance.  Respiratory: Negative for cough, chest tightness and shortness of breath.   Cardiovascular: Negative for chest pain, palpitations and leg swelling.  Gastrointestinal: Negative for nausea, vomiting, abdominal pain, diarrhea, constipation and blood in stool.  Genitourinary: Negative for dysuria, urgency, frequency and hematuria.  Musculoskeletal: Negative for myalgias, arthralgias, gait problem and neck stiffness.  Skin: Negative for rash.  Neurological: Negative for dizziness, speech difficulty, weakness, light-headedness, numbness and headaches.  Hematological: Negative for adenopathy.  Psychiatric/Behavioral: Negative for confusion and sleep disturbance. The patient is not nervous/anxious.        Patient Active Problem List   Diagnosis Date Noted  . Leucocytosis 06/26/2015  . Hyperlipidemia 08/17/2014  . Essential hypertension, benign   . Microalbuminuria   . Vitamin D deficiency   . Obesity   . Fibroids   .  Headache      Prior to Admission medications   Medication Sig Start Date End Date Taking? Authorizing Provider  enalapril (VASOTEC) 10 MG tablet Take 1 tablet (10 mg total) by mouth daily. 08/14/14  Yes Anelis Hrivnak, PA-C  hydrochlorothiazide (MICROZIDE) 12.5 MG capsule Take 1 capsule (12.5 mg total) by mouth daily. 12/15/14  Yes Darlyne Russian, MD  metoprolol succinate (TOPROL-XL) 100 MG 24 hr tablet Take 1 tablet (100 mg total) by mouth daily. 08/14/14  Yes Izzabell Klasen, PA-C  pravastatin (PRAVACHOL) 20 MG tablet Take 1 tablet (20 mg total) by mouth daily. Take in the evening. 08/14/14 11/04/15 Yes Danne Scardina, PA-C  meloxicam (MOBIC) 7.5 MG tablet Take 1-2 tablets daily at onset of headache. Do not take the medication every day. Patient not taking: Reported on 10/29/2015 12/15/14   Darlyne Russian, MD  Vitamin D, Ergocalciferol, (DRISDOL) 50000 UNITS CAPS capsule Take 1 capsule (50,000 Units total) by mouth every 7 (seven) days. Patient not taking: Reported on 10/29/2015 06/26/15   Harrison Mons, PA-C     No Known Allergies     Objective:  Physical Exam  Constitutional: She is oriented to person, place, and time. She appears well-developed and well-nourished. She is active and cooperative. No distress.  BP 163/88 mmHg  Pulse 64  Temp(Src) 99 F (37.2 C)  Resp 16  Ht 5\' 8"  (1.727 m)  Wt 251 lb (113.853 kg)  BMI 38.17 kg/m2  HENT:  Head: Normocephalic and atraumatic.  Right Ear: Hearing normal.  Left Ear: Hearing normal.  Eyes: Conjunctivae are normal. No scleral icterus.  Neck: Normal range of motion. Neck supple. No thyromegaly present.  Cardiovascular: Normal rate, regular rhythm and normal heart sounds.   Pulses:      Radial pulses are 2+ on the right side, and 2+ on the left side.  Pulmonary/Chest: Effort normal and breath sounds normal.  Lymphadenopathy:       Head (right side): No tonsillar, no preauricular, no posterior auricular and no occipital adenopathy present.        Head (left side): No tonsillar, no preauricular, no posterior auricular and no occipital adenopathy present.    She has no cervical adenopathy.       Right: No supraclavicular adenopathy present.       Left: No supraclavicular adenopathy present.  Neurological: She is alert and oriented to person, place, and time. No sensory deficit.  Skin: Skin is warm, dry and intact. No rash noted. No cyanosis or erythema. Nails show no clubbing.  Psychiatric: She has a normal mood and affect. Her speech is normal and behavior is normal.           Assessment & Plan:   1. Essential hypertension, benign Not well controlled. She will check her BP at home, BIW, and record the results. We will follow-up in 3 months and consider increasing the enalapril dose if she's not consistently below 140/90. COntinue healthy lifestyle changes. - metoprolol succinate (TOPROL-XL) 100 MG 24 hr tablet; Take 1 tablet (100 mg total) by mouth daily.  Dispense: 90 tablet; Refill: 3 - hydrochlorothiazide (MICROZIDE) 12.5 MG capsule; Take 1 capsule (12.5 mg total) by mouth daily.  Dispense: 90 capsule; Refill: 3 - enalapril (VASOTEC) 10 MG tablet; Take 1 tablet (10 mg total) by mouth daily.  Dispense: 90 tablet; Refill: 1  2. Hyperlipidemia Not fasting today. Update lipids at next visit. - pravastatin (PRAVACHOL) 20 MG tablet; Take 1 tablet (20 mg total) by mouth daily. Take in the evening.  Dispense: 90 tablet; Refill: 3  3. Vitamin D deficiency Continue supplementation. Update lab at next visit. - Vitamin D, Ergocalciferol, (DRISDOL) 50000 units CAPS capsule; Take 1 capsule (50,000 Units total) by mouth every 7 (seven) days.  Dispense: 12 capsule; Refill: 3  4. Headache, unspecified headache type Stable. Continue PRN use of meloxicam. - meloxicam (MOBIC) 7.5 MG tablet; Take 1-2 tablets daily at onset of headache. Do not take the medication every day.  Dispense: 20 tablet; Refill: 1   Fara Chute, PA-C Physician  Assistant-Certified [facility]

## 2015-10-29 NOTE — Patient Instructions (Addendum)
Check your blood pressure twice a week and record the results. If the readings are consistently higher than 140/90, let me know.  Read [place]  I Love You Forever by Kittie Plater     IF you received an x-ray today, you will receive an invoice from [facility]. Please contact [facility] at [phone] with questions or concerns regarding your invoice.   IF you received labwork today, you will receive an invoice from [employer]. Please contact [facility] at [phone] with questions or concerns regarding your invoice.   Our billing staff will not be able to assist you with questions regarding bills from these companies.  You will be contacted with the lab results as soon as they are available. The fastest way to get your results is to activate your My Chart account. Instructions are located on the last page of this paperwork. If you have not heard from [place] regarding the results in 2 weeks, please contact this office.

## 2016-01-28 ENCOUNTER — Ambulatory Visit: Payer: Federal, State, Local not specified - PPO | Admitting: Physician Assistant

## 2016-08-10 ENCOUNTER — Telehealth: Payer: Self-pay | Admitting: Physician Assistant

## 2016-08-10 ENCOUNTER — Encounter: Payer: Self-pay | Admitting: Student

## 2016-08-10 NOTE — Telephone Encounter (Signed)
MyChart message sent to pt. Pt advised that pharmacy notified us of lack of refill of her BP medication (Enalapril), last 90-day supply filled on 04/04/16. Pt last seen April 2017. Pt advised to schedule a visit soon to follow up on her blood pressure.

## 2016-08-26 ENCOUNTER — Encounter: Payer: Self-pay | Admitting: Physician Assistant

## 2016-08-26 ENCOUNTER — Ambulatory Visit (INDEPENDENT_AMBULATORY_CARE_PROVIDER_SITE_OTHER): Payer: Federal, State, Local not specified - PPO | Admitting: Physician Assistant

## 2016-08-26 VITALS — BP 122/82 | HR 75 | Temp 98.9°F | Resp 16 | Ht 68.0 in | Wt 252.8 lb

## 2016-08-26 DIAGNOSIS — Z0000 Encounter for general adult medical examination without abnormal findings: Secondary | ICD-10-CM | POA: Diagnosis not present

## 2016-08-26 DIAGNOSIS — E559 Vitamin D deficiency, unspecified: Secondary | ICD-10-CM

## 2016-08-26 DIAGNOSIS — Z113 Encounter for screening for infections with a predominantly sexual mode of transmission: Secondary | ICD-10-CM | POA: Diagnosis not present

## 2016-08-26 DIAGNOSIS — L918 Other hypertrophic disorders of the skin: Secondary | ICD-10-CM

## 2016-08-26 DIAGNOSIS — D72829 Elevated white blood cell count, unspecified: Secondary | ICD-10-CM | POA: Diagnosis not present

## 2016-08-26 DIAGNOSIS — Z124 Encounter for screening for malignant neoplasm of cervix: Secondary | ICD-10-CM

## 2016-08-26 DIAGNOSIS — R51 Headache: Secondary | ICD-10-CM | POA: Diagnosis not present

## 2016-08-26 DIAGNOSIS — Z1211 Encounter for screening for malignant neoplasm of colon: Secondary | ICD-10-CM

## 2016-08-26 DIAGNOSIS — E785 Hyperlipidemia, unspecified: Secondary | ICD-10-CM

## 2016-08-26 DIAGNOSIS — R7989 Other specified abnormal findings of blood chemistry: Secondary | ICD-10-CM

## 2016-08-26 DIAGNOSIS — N938 Other specified abnormal uterine and vaginal bleeding: Secondary | ICD-10-CM | POA: Diagnosis not present

## 2016-08-26 DIAGNOSIS — I10 Essential (primary) hypertension: Secondary | ICD-10-CM | POA: Diagnosis not present

## 2016-08-26 DIAGNOSIS — R809 Proteinuria, unspecified: Secondary | ICD-10-CM

## 2016-08-26 DIAGNOSIS — Z6838 Body mass index (BMI) 38.0-38.9, adult: Secondary | ICD-10-CM

## 2016-08-26 DIAGNOSIS — R519 Headache, unspecified: Secondary | ICD-10-CM

## 2016-08-26 LAB — POCT URINALYSIS DIP (MANUAL ENTRY)
BILIRUBIN UA: NEGATIVE
Bilirubin, UA: NEGATIVE
GLUCOSE UA: NEGATIVE
Leukocytes, UA: NEGATIVE
Nitrite, UA: NEGATIVE
Protein Ur, POC: >=300 — AB
UROBILINOGEN UA: 0.2
pH, UA: 5.5

## 2016-08-26 NOTE — Patient Instructions (Addendum)
     IF you received an x-ray today, you will receive an invoice from [facility]. Please contact [facility] at [phone] with questions or concerns regarding your invoice.   IF you received labwork today, you will receive an invoice from [facility]. Please contact [facility] at [phone] with questions or concerns regarding your invoice.   Our billing staff will not be able to assist you with questions regarding bills from these companies.  You will be contacted with the lab results as soon as they are available. The fastest way to get your results is to activate your My Chart account. Instructions are located on the last page of this paperwork. If you have not heard from us regarding the results in 2 weeks, please contact this office.    We recommend that you schedule a mammogram for breast cancer screening. Typically, you do not need a referral to do this. Please contact a local imaging center to schedule your mammogram.  [facility] - [phone]  *ask for the Radiology Department [facility]) - [phone] or [phone]  [facility] - [phone] [facility] - [phone] [facility] - [phone]  *ask for the Radiology Department [facility] - [phone]  *ask for the Radiology Department [facility] - [phone]  *ask for the Mammography Department [facility] - [phone] 

## 2016-08-26 NOTE — Progress Notes (Signed)
Patient ID: Kathleen Mata, female    DOB: 1966-06-27, 51 y.o.   MRN: [id number]  PCP: Harrison Mons, PA-C  Chief Complaint  Patient presents with  . Annual Exam    w/pap    Subjective:   Presents for [employer].  Cervical Cancer Screening: last pap 08/16/2012. History of abnormal pap x 1, normal on repeat. Breast Cancer Screening: mammogram 01/30/2013, normal Colorectal Cancer Screening: not yet Bone Density Testing: not yet a candidate HIV Screening: complete 2016 STI Screening: new partner. Condom came off during first intercourse 1 week ago. Seasonal Influenza Vaccination: declines, "I never get them." Td/Tdap Vaccination: current 06/2009 Pneumococcal Vaccination: not yet a candidate Zoster Vaccination: not yet a candidate Frequency of Dental evaluation: Q6 months Frequency of Eye evaluation: hasn't been in a while; infrequent     Patient Active Problem List   Diagnosis Date Noted  . Leucocytosis 06/26/2015  . Hyperlipidemia 08/17/2014  . Essential hypertension, benign   . Microalbuminuria   . Vitamin D deficiency   . Obesity   . Fibroids   . Headache     Past Medical History:  Diagnosis Date  . Essential hypertension, benign   . Fibroids   . Headache(784.0)   . Microalbuminuria   . Obesity, unspecified   . Vitamin D deficiency      Prior to Admission medications   Medication Sig Start Date End Date Taking? Authorizing Provider  enalapril (VASOTEC) 10 MG tablet Take 1 tablet (10 mg total) by mouth daily. 10/29/15  Yes Emmarose Klinke, PA-C  metoprolol succinate (TOPROL-XL) 100 MG 24 hr tablet Take 1 tablet (100 mg total) by mouth daily. 10/29/15  Yes Tarquin Welcher, PA-C  pravastatin (PRAVACHOL) 20 MG tablet Take 1 tablet (20 mg total) by mouth daily. Take in the evening. 10/29/15 01/18/17 Yes Genea Rheaume, PA-C  Vitamin D, Ergocalciferol, (DRISDOL) 50000 units CAPS capsule Take 1 capsule (50,000 Units total) by mouth every 7 (seven) days. 10/29/15   Yes Maleea Camilo, PA-C  hydrochlorothiazide (MICROZIDE) 12.5 MG capsule Take 1 capsule (12.5 mg total) by mouth daily. Patient not taking: Reported on 08/26/2016 10/29/15   Harrison Mons, PA-C  meloxicam (MOBIC) 7.5 MG tablet Take 1-2 tablets daily at onset of headache. Do not take the medication every day. Patient not taking: Reported on 08/26/2016 10/29/15   Harrison Mons, PA-C    No Known Allergies  Past Surgical History:  Procedure Laterality Date  . ENDOMETRIAL ABLATION    . WISDOM TOOTH EXTRACTION      Family History  Problem Relation Age of Onset  . Hypertension Mother   . Heart disease Mother   . Hypertension Father   . Kidney disease Father   . Sarcoidosis Brother 45    Social History   Social History  . Marital status: Single    Spouse name: n/a  . Number of children: 1  . Years of education: 57   Occupational History  . [name]   Social History Main Topics  . Smoking status: Never Smoker  . Smokeless tobacco: Never Used  . Alcohol use 1.2 - 1.8 oz/week    2 - 3 Standard drinks or equivalent per week  . Drug use: No  . Sexual activity: Yes    Birth control/ protection: None, Other-see comments     Comment: s/p ablation   Other Topics Concern  . None   Social History Narrative   Patient is right handed, consumes  caffeine rarely.   Her son, [name], lives with her. He graduated from high school 12/2015.       Review of Systems  Constitutional: Negative.   HENT: Negative.   Eyes: Negative.   Respiratory: Negative.   Cardiovascular: Negative.   Gastrointestinal: Negative.   Endocrine: Negative.   Genitourinary: Positive for menstrual problem (vaginal spotting 2 weeks ago, after years of amenorrhea s/p ablation).  Musculoskeletal: Negative.   Skin: Negative.        Skin tag in the vaginal area  Allergic/Immunologic: Negative.   Neurological: Positive for headaches (only when she misses doses of her BP medication, which  is rare).  Hematological: Negative.   Psychiatric/Behavioral: Negative.         Objective:  Physical Exam  Constitutional: She is oriented to person, place, and time. Vital signs are normal. She appears well-developed and well-nourished. She is active and cooperative. No distress.  BP (!) 132/100   Pulse 75   Temp 98.9 F (37.2 C) (Oral)   Resp 16   Ht 5\' 8"  (1.727 m)   Wt 252 lb 12.8 oz (114.7 kg)   LMP 08/12/2016 Comment: "spotting, with cramps"  SpO2 98%   BMI 38.44 kg/m    HENT:  Head: Normocephalic and atraumatic.  Right Ear: Hearing, tympanic membrane, external ear and ear canal normal. No foreign bodies.  Left Ear: Hearing, tympanic membrane, external ear and ear canal normal. No foreign bodies.  Nose: Nose normal.  Mouth/Throat: Uvula is midline, oropharynx is clear and moist and mucous membranes are normal. No oral lesions. Normal dentition. No dental abscesses or uvula swelling. No oropharyngeal exudate.  Eyes: Conjunctivae, EOM and lids are normal. Pupils are equal, round, and reactive to light. Right eye exhibits no discharge. Left eye exhibits no discharge. No scleral icterus.  Fundoscopic exam:      The right eye shows no arteriolar narrowing, no AV nicking, no exudate, no hemorrhage and no papilledema.       The left eye shows no arteriolar narrowing, no AV nicking, no exudate, no hemorrhage and no papilledema.  Neck: Trachea normal, normal range of motion and full passive range of motion without pain. Neck supple. No spinous process tenderness and no muscular tenderness present. No thyroid mass and no thyromegaly present.  Cardiovascular: Normal rate, regular rhythm, normal heart sounds, intact distal pulses and normal pulses.   Pulmonary/Chest: Effort normal and breath sounds normal. She exhibits no tenderness and no retraction. Right breast exhibits no inverted nipple, no mass, no nipple discharge, no skin change and no tenderness. Left breast exhibits no inverted  nipple, no mass, no nipple discharge, no skin change and no tenderness. Breasts are symmetrical.  Abdominal: Soft. Normal appearance and bowel sounds are normal. She exhibits no distension and no mass. There is no hepatosplenomegaly. There is no tenderness. There is no rigidity, no rebound, no guarding, no CVA tenderness, no tenderness at McBurney's point and negative Murphy's sign. No hernia. Hernia confirmed negative in the right inguinal area and confirmed negative in the left inguinal area.  Genitourinary: Rectum normal, vagina normal and uterus normal. Rectal exam shows no external hemorrhoid and no fissure.    No breast swelling, tenderness, discharge or bleeding. Pelvic exam was performed with patient supine. No labial fusion. There is no rash, tenderness, lesion or injury on the right labia. There is no rash, tenderness, lesion or injury on the left labia. Cervix exhibits no motion tenderness, no discharge and no friability. Right adnexum displays no  mass, no tenderness and no fullness. Left adnexum displays no mass, no tenderness and no fullness. No erythema, tenderness or bleeding in the vagina. No foreign body in the vagina. No signs of injury around the vagina. No vaginal discharge found.  Musculoskeletal: She exhibits no edema or tenderness.       Cervical back: Normal.       Thoracic back: Normal.       Lumbar back: Normal.  Lymphadenopathy:       Head (right side): No tonsillar, no preauricular, no posterior auricular and no occipital adenopathy present.       Head (left side): No tonsillar, no preauricular, no posterior auricular and no occipital adenopathy present.    She has no cervical adenopathy.    She has no axillary adenopathy.       Right: No inguinal and no supraclavicular adenopathy present.       Left: No inguinal and no supraclavicular adenopathy present.  Neurological: She is alert and oriented to person, place, and time. She has normal strength and normal reflexes. No  cranial nerve deficit. She exhibits normal muscle tone. Coordination and gait normal.  Skin: Skin is warm, dry and intact. No rash noted. She is not diaphoretic. No cyanosis or erythema. Nails show no clubbing.  Psychiatric: She has a normal mood and affect. Her speech is normal and behavior is normal. Judgment and thought content normal.    BP Readings from Last 3 Encounters:  08/26/16 122/82, repeat   10/29/15 (!) 163/88  06/25/15 (!) 150/96          Assessment & Plan:  1. Annual physical exam Age appropriate anticipatory guidance provided.  2. DUB (dysfunctional uterine bleeding) Amenorrhea s/p ablation 2 years ago. Needs re-evaluation with GYN.  3. Skin tag Benign. May schedule removal at her convenience.  4. Routine screening for STI (sexually transmitted infection) Await lab results. - HIV antibody - Pap IG, CT/NG NAA, and HPV (high risk) - RPR  5. Hyperlipidemia, unspecified hyperlipidemia type Await lab results. - Comprehensive metabolic panel - Lipid panel  6. Essential hypertension, benign Controlled. Continue current treatment. - Comprehensive metabolic panel - POCT urinalysis dipstick - TSH - Care order/instruction:  7. Microalbuminuria Continue BP control.  8. Leukocytosis, unspecified type Update CBC - CBC with Differential/Platelet  9. Nonintractable episodic headache, unspecified headache type Only with loss of BP control.  10. Vitamin D deficiency Await lab results. - VITAMIN D 25 Hydroxy (Vit-D Deficiency, Fractures)  11. BMI 38.0-38.9,adult Healthy lifestyle changes  12. Screening for colon cancer - Ambulatory referral to Gastroenterology  13. Screening for cervical cancer If cytology and HPV both negative, repeat co-testing in 5 years. - Pap IG, CT/NG NAA, and HPV (high risk)   Fara Chute, PA-C Physician Assistant-Certified [facility] [place]

## 2016-08-26 NOTE — Progress Notes (Deleted)
    Patient ID: Kathleen Mata, female    DOB: September 18, 1965, 51 y.o.   MRN: [id number]  PCP: Harrison Mons, PA-C  Chief Complaint  Patient presents with  . Annual Exam    w/pap    Subjective:   Presents for [employer].  *** HTN  Cervical Cancer Screening: *** Breast Cancer Screening: *** Colorectal Cancer Screening: *** Bone Density Testing: *** HIV Screening: 06/25/15 STI Screening: *** Seasonal Influenza Vaccination: *** Td/Tdap Vaccination: 07/04/09 Pneumococcal Vaccination: *** Zoster Vaccination: *** Frequency of Dental evaluation: ***Q6 months Frequency of Eye evaluation: ***    Patient Active Problem List   Diagnosis Date Noted  . Leucocytosis 06/26/2015  . Hyperlipidemia 08/17/2014  . Essential hypertension, benign   . Microalbuminuria   . Vitamin D deficiency   . Obesity   . Fibroids   . Headache     Past Medical History:  Diagnosis Date  . Essential hypertension, benign   . Fibroids   . Headache(784.0)   . Microalbuminuria   . Obesity, unspecified   . Vitamin D deficiency      Prior to Admission medications   Medication Sig Start Date End Date Taking? Authorizing Provider  enalapril (VASOTEC) 10 MG tablet Take 1 tablet (10 mg total) by mouth daily. 10/29/15  Yes Chelle Jeffery, PA-C  hydrochlorothiazide (MICROZIDE) 12.5 MG capsule Take 1 capsule (12.5 mg total) by mouth daily. 10/29/15   Chelle Jeffery, PA-C  meloxicam (MOBIC) 7.5 MG tablet Take 1-2 tablets daily at onset of headache. Do not take the medication every day. 10/29/15   Chelle Jeffery, PA-C  metoprolol succinate (TOPROL-XL) 100 MG 24 hr tablet Take 1 tablet (100 mg total) by mouth daily. 10/29/15   Chelle Jeffery, PA-C  pravastatin (PRAVACHOL) 20 MG tablet Take 1 tablet (20 mg total) by mouth daily. Take in the evening. 10/29/15 01/18/17  Chelle Jeffery, PA-C  Vitamin D, Ergocalciferol, (DRISDOL) 50000 units CAPS capsule Take 1 capsule (50,000 Units total) by mouth every 7 (seven)  days. 10/29/15   Harrison Mons, PA-C    No Known Allergies  Past Surgical History:  Procedure Laterality Date  . ENDOMETRIAL ABLATION    . WISDOM TOOTH EXTRACTION      Family History  Problem Relation Age of Onset  . Hypertension Mother   . Heart disease Mother   . Hypertension Father   . Kidney disease Father   . Sarcoidosis Brother 32    Social History   Social History  . Marital status: Single    Spouse name: n/a  . Number of children: 1  . Years of education: 7   Occupational History  . Kathleen M[employer]    Kathleen Mata   Social History Main Topics  . Smoking status: Never Smoker  . Smokeless tobacco: Never Used  . Alcohol use Yes     Comment: 2-3 drinks daily  . Drug use: No  . Sexual activity: Yes    Birth control/ protection: None   Other Topics Concern  . None   Social History Narrative   Patient is right handed, consumes caffeine rarely.   Her son, Kathleen Mata, lives with her. He graduates from high school 12/2015.       Review of Systems      Objective:  Physical Exam         Assessment & Plan:  *** Kathleen Nimrod, PA-S

## 2016-08-27 LAB — COMPREHENSIVE METABOLIC PANEL
ALBUMIN: 4.1 g/dL (ref 3.5–5.5)
ALT: 26 IU/L (ref 0–32)
AST: 20 IU/L (ref 0–40)
Albumin/Globulin Ratio: 1.1 — ABNORMAL LOW (ref 1.2–2.2)
Alkaline Phosphatase: 88 IU/L (ref 39–117)
BUN / CREAT RATIO: 14 (ref 9–23)
BUN: 14 mg/dL (ref 6–24)
Bilirubin Total: 0.5 mg/dL (ref 0.0–1.2)
CALCIUM: 9.2 mg/dL (ref 8.7–10.2)
CO2: 24 mmol/L (ref 18–29)
CREATININE: 1.02 mg/dL — AB (ref 0.57–1.00)
Chloride: 103 mmol/L (ref 96–106)
GFR calc Af Amer: 74 mL/min/{1.73_m2} (ref >59)
GFR, EST NON AFRICAN AMERICAN: 64 mL/min/{1.73_m2} (ref >59)
GLOBULIN, TOTAL: 3.8 g/dL (ref 1.5–4.5)
Glucose: 97 mg/dL (ref 65–99)
Potassium: 4.3 mmol/L (ref 3.5–5.2)
SODIUM: 143 mmol/L (ref 134–144)
Total Protein: 7.9 g/dL (ref 6.0–8.5)

## 2016-08-27 LAB — CBC WITH DIFFERENTIAL/PLATELET
Basophils Absolute: 0 10*3/uL (ref 0.0–0.2)
Basos: 0 %
EOS (ABSOLUTE): 0 10*3/uL (ref 0.0–0.4)
EOS: 1 %
HEMATOCRIT: 43.1 % (ref 34.0–46.6)
HEMOGLOBIN: 14.5 g/dL (ref 11.1–15.9)
IMMATURE GRANULOCYTES: 0 %
Immature Grans (Abs): 0 10*3/uL (ref 0.0–0.1)
Lymphocytes Absolute: 1.8 10*3/uL (ref 0.7–3.1)
Lymphs: 46 %
MCH: 30.5 pg (ref 26.6–33.0)
MCHC: 33.6 g/dL (ref 31.5–35.7)
MCV: 91 fL (ref 79–97)
MONOCYTES: 12 %
Monocytes Absolute: 0.5 10*3/uL (ref 0.1–0.9)
NEUTROS PCT: 41 %
Neutrophils Absolute: 1.6 10*3/uL (ref 1.4–7.0)
Platelets: 292 10*3/uL (ref 150–379)
RBC: 4.75 x10E6/uL (ref 3.77–5.28)
RDW: 13.9 % (ref 12.3–15.4)
WBC: 3.9 10*3/uL (ref 3.4–10.8)

## 2016-08-27 LAB — LIPID PANEL
CHOLESTEROL TOTAL: 241 mg/dL — AB (ref 100–199)
Chol/HDL Ratio: 3.5 ratio units (ref 0.0–4.4)
HDL: 68 mg/dL (ref >39)
LDL Calculated: 158 mg/dL — ABNORMAL HIGH (ref 0–99)
TRIGLYCERIDES: 77 mg/dL (ref 0–149)
VLDL Cholesterol Cal: 15 mg/dL (ref 5–40)

## 2016-08-27 LAB — VITAMIN D 25 HYDROXY (VIT D DEFICIENCY, FRACTURES): VIT D 25 HYDROXY: 21.2 ng/mL — AB (ref 30.0–100.0)

## 2016-08-27 LAB — RPR: RPR Ser Ql: NONREACTIVE

## 2016-08-27 LAB — TSH: TSH: 1.26 u[IU]/mL (ref 0.450–4.500)

## 2016-08-27 LAB — HIV ANTIBODY (ROUTINE TESTING W REFLEX): HIV Screen 4th Generation wRfx: NONREACTIVE

## 2016-08-28 LAB — PAP IG, CT-NG NAA, HPV HIGH-RISK
Chlamydia, Nuc. Acid Amp: NEGATIVE
GONOCOCCUS BY NUCLEIC ACID AMP: NEGATIVE
HPV, HIGH-RISK: NEGATIVE
PAP Smear Comment: 0

## 2016-09-03 ENCOUNTER — Other Ambulatory Visit: Payer: Self-pay | Admitting: Physician Assistant

## 2016-09-03 DIAGNOSIS — I10 Essential (primary) hypertension: Secondary | ICD-10-CM

## 2016-09-03 MED ORDER — PRAVASTATIN SODIUM 40 MG PO TABS: 40.0000 mg | ORAL_TABLET | Freq: Every evening | ORAL | 3 refills | Status: DC

## 2016-09-03 MED ORDER — VITAMIN D (ERGOCALCIFEROL) 1.25 MG (50000 UNIT) PO CAPS: 50000.0000 [IU] | ORAL_CAPSULE | ORAL | 3 refills | Status: DC

## 2016-09-03 NOTE — Telephone Encounter (Signed)
Meds ordered this encounter  Medications  . enalapril (VASOTEC) 10 MG tablet    Sig: TAKE 1 TABLET(10 MG) BY MOUTH DAILY    Dispense:  90 tablet    Refill:  3

## 2016-09-22 ENCOUNTER — Telehealth: Payer: Self-pay | Admitting: Physician Assistant

## 2016-09-22 NOTE — Telephone Encounter (Signed)
Patient needs FMLA forms completed by Chelle. I have completed the forms based off the last set of FMLA forms that were completed in 2016, if something needs to be changed please do so. I will place the forms in your box on 09/22/16 if you could please return them to the FMLA/Disability box at the [employer] checkout desk within 5-7 business days. Thank you!

## 2016-09-22 NOTE — Telephone Encounter (Signed)
Forms completed and returned to the FMLA/Disability box at [employer] checkout

## 2016-09-30 DIAGNOSIS — Z0271 Encounter for disability determination: Secondary | ICD-10-CM

## 2016-10-26 ENCOUNTER — Encounter: Payer: Self-pay | Admitting: Physician Assistant

## 2017-03-05 ENCOUNTER — Other Ambulatory Visit: Payer: Self-pay | Admitting: Physician Assistant

## 2017-03-05 DIAGNOSIS — I10 Essential (primary) hypertension: Secondary | ICD-10-CM

## 2017-03-08 NOTE — Telephone Encounter (Signed)
mychart message sent to pt about making an apt for more refills °

## 2017-04-21 ENCOUNTER — Telehealth: Payer: Self-pay

## 2017-04-21 NOTE — Telephone Encounter (Signed)
Tried to call patient to notify her of overdue health maintenance, which includes a mammogram.  The VM was full, and I could not leave a message.

## 2017-06-08 ENCOUNTER — Other Ambulatory Visit: Payer: Self-pay

## 2017-06-08 ENCOUNTER — Ambulatory Visit: Payer: Federal, State, Local not specified - PPO | Admitting: Physician Assistant

## 2017-06-08 ENCOUNTER — Encounter: Payer: Self-pay | Admitting: Physician Assistant

## 2017-06-08 ENCOUNTER — Encounter: Payer: Self-pay | Admitting: Gastroenterology

## 2017-06-08 VITALS — BP 128/90 | HR 71 | Temp 99.0°F | Resp 18 | Ht 68.0 in | Wt 246.0 lb

## 2017-06-08 DIAGNOSIS — Z1211 Encounter for screening for malignant neoplasm of colon: Secondary | ICD-10-CM | POA: Diagnosis not present

## 2017-06-08 DIAGNOSIS — I10 Essential (primary) hypertension: Secondary | ICD-10-CM | POA: Diagnosis not present

## 2017-06-08 DIAGNOSIS — N898 Other specified noninflammatory disorders of vagina: Secondary | ICD-10-CM

## 2017-06-08 DIAGNOSIS — Z1231 Encounter for screening mammogram for malignant neoplasm of breast: Secondary | ICD-10-CM

## 2017-06-08 LAB — POCT WET + KOH PREP
Trich by wet prep: ABSENT
YEAST BY KOH: ABSENT
Yeast by wet prep: ABSENT

## 2017-06-08 MED ORDER — METOPROLOL SUCCINATE ER 100 MG PO TB24: ORAL_TABLET | ORAL | 3 refills | Status: AC

## 2017-06-08 MED ORDER — HYDROCHLOROTHIAZIDE 12.5 MG PO CAPS: 12.5000 mg | ORAL_CAPSULE | Freq: Every day | ORAL | 3 refills | Status: DC

## 2017-06-08 MED ORDER — FLUCONAZOLE 150 MG PO TABS: 150.0000 mg | ORAL_TABLET | Freq: Once | ORAL | 0 refills | Status: AC

## 2017-06-08 NOTE — Assessment & Plan Note (Signed)
Not to goal, likely due to stopping HCTZ. Continue enalapril. Resume HCTZ. Continue metoprolol (also for migraine prevention).

## 2017-06-08 NOTE — Progress Notes (Signed)
Patient ID: Kathleen Mata, female    DOB: [birth year], 51 y.o.   MRN: [id number]  PCP: Harrison Mons, PA-C  Chief Complaint  Patient presents with  . Vaginal Itching    x2 weeks, pt states she isn't having any discharge, pt states she did have a rash start of the symptoms.    Subjective:   Presents for evaluation of vaginal itching x 2 weeks.  Embarrassed. She had a rash initially, that resolved. Redness, no bumps. No associated vaginal discharge. Amenorrhea s/p ablation. No urinary urgency, frequency or burning.  One current female sexual partner x 12 months. No concerns that he has other partners. No new products used other than a different fabric softener.  Stopped HCTZ because she ran out.    Review of Systems As above.    Patient Active Problem List   Diagnosis Date Noted  . Leucocytosis 06/26/2015  . Hyperlipidemia 08/17/2014  . Essential hypertension, benign   . Microalbuminuria   . Vitamin D deficiency   . BMI 38.0-38.9,adult   . Fibroids   . Headache      Prior to Admission medications   Medication Sig Start Date End Date Taking? Authorizing Provider  enalapril (VASOTEC) 10 MG tablet TAKE 1 TABLET(10 MG) BY MOUTH DAILY 09/03/16  Yes Reisa Coppola, PA-C  metoprolol succinate (TOPROL-XL) 100 MG 24 hr tablet TAKE 1 TABLET(100 MG) BY MOUTH DAILY 03/05/17  Yes Ally Knodel, PA-C  pravastatin (PRAVACHOL) 40 MG tablet Take 1 tablet (40 mg total) by mouth every evening. 09/03/16 11/24/17 Yes Corinda Ammon, PA-C  Vitamin D, Ergocalciferol, (DRISDOL) 50000 units CAPS capsule Take 1 capsule (50,000 Units total) by mouth every 7 (seven) days. 09/03/16  Yes Nolyn Swab, PA-C  hydrochlorothiazide (MICROZIDE) 12.5 MG capsule Take 1 capsule (12.5 mg total) by mouth daily. Patient not taking: Reported on 06/08/2017 10/29/15   Harrison Mons, PA-C  meloxicam (MOBIC) 7.5 MG tablet Take 1-2 tablets daily at onset of headache. Do not take the medication every  day. Patient not taking: Reported on 08/26/2016 10/29/15   Harrison Mons, PA-C     No Known Allergies     Objective:  Physical Exam  Constitutional: She is oriented to person, place, and time. She appears well-developed and well-nourished. She is active and cooperative. No distress.  BP 128/90 (BP Location: Right Arm, Patient Position: Sitting, Cuff Size: Large)   Pulse 71   Temp 99 F (37.2 C) (Oral)   Resp 18   Ht 5\' 8"  (1.727 m)   Wt 246 lb (111.6 kg)   SpO2 98%   BMI 37.40 kg/m    Eyes: Conjunctivae are normal.  Pulmonary/Chest: Effort normal.  Genitourinary:    Pelvic exam was performed with patient supine. No labial fusion. There is no rash, tenderness, lesion or injury on the right labia. There is no rash, tenderness, lesion or injury on the left labia.  Lymphadenopathy:       Right: No inguinal adenopathy present.       Left: No inguinal adenopathy present.  Neurological: She is alert and oriented to person, place, and time.  Psychiatric: She has a normal mood and affect. Her speech is normal and behavior is normal.   Results for orders placed or performed in visit on 06/08/17  POCT Wet + KOH Prep  Result Value Ref Range   Yeast by KOH Absent Absent   Yeast by wet prep Absent Absent   WBC by wet prep Few Few   Clue  Cells Wet Prep HPF POC None None   Trich by wet prep Absent Absent   Bacteria Wet Prep HPF POC Many (A) Few   Epithelial Cells By [employer] ([facility]) Few None, Few, Too numerous to count   RBC,UR,HPF,POC None None RBC/hpf       Assessment & Plan:   Problem List Items Addressed This Visit    Essential hypertension, benign    Not to goal, likely due to stopping HCTZ. Continue enalapril. Resume HCTZ. Continue metoprolol (also for migraine prevention).      Relevant Medications   metoprolol succinate (TOPROL-XL) 100 MG 24 hr tablet   hydrochlorothiazide (MICROZIDE) 12.5 MG capsule    Other Visit Diagnoses    Vaginal itching    -  Primary    Relevant Medications   fluconazole (DIFLUCAN) 150 MG tablet   Other Relevant Orders   POCT Wet + KOH Prep (Completed)   Screening for colon cancer       Relevant Orders   Ambulatory referral to Gastroenterology   Encounter for screening mammogram for breast cancer       Relevant Orders   MM Digital Diagnostic Bilat       Return in about 3 months (around 09/08/2017) for Annual Exam and fasting labs.   Fara Chute, PA-C [facility]

## 2017-06-08 NOTE — Patient Instructions (Addendum)
Let me know if the itching persists.   We recommend that you schedule a mammogram for breast cancer screening. Typically, you do not need a referral to do this. Please contact a local imaging center to schedule your mammogram.  [facility] - [phone]  *ask for the Radiology Department [facility] - [phone] or [phone]  [facility] - [phone] [place] [phone] [facility]  - [phone]  *ask for the [facility] - [phone]  *ask for the Radiology Department [facility] - [phone]  *ask for the [facility] - [phone]   IF you received an x-ray today, you will receive an invoice from [facility]. Please contact [facility] at [phone] with questions or concerns regarding your invoice.   IF you received labwork today, you will receive an invoice from [facility]. Please contact [facility] at [phone] with questions or concerns regarding your invoice.   Our billing staff will not be able to assist you with questions regarding bills from these companies.  You will be contacted with the lab results as soon as they are available. The fastest way to get your results is to activate your My Chart account. Instructions are located on the last page of this paperwork. If you have not heard from [place] regarding the results in 2 weeks, please contact this office.

## 2017-07-08 ENCOUNTER — Ambulatory Visit (AMBULATORY_SURGERY_CENTER): Payer: Self-pay | Admitting: *Deleted

## 2017-07-08 ENCOUNTER — Other Ambulatory Visit: Payer: Self-pay | Admitting: Physician Assistant

## 2017-07-08 ENCOUNTER — Encounter: Payer: Self-pay | Admitting: *Deleted

## 2017-07-08 ENCOUNTER — Other Ambulatory Visit: Payer: Self-pay

## 2017-07-08 VITALS — Ht 68.0 in | Wt 253.8 lb

## 2017-07-08 DIAGNOSIS — Z1231 Encounter for screening mammogram for malignant neoplasm of breast: Secondary | ICD-10-CM

## 2017-07-08 DIAGNOSIS — Z1211 Encounter for screening for malignant neoplasm of colon: Secondary | ICD-10-CM

## 2017-07-08 MED ORDER — PEG-KCL-NACL-NASULF-NA ASC-C 140 G PO SOLR: 1.0000 | ORAL | 0 refills | Status: DC

## 2017-07-08 NOTE — Progress Notes (Signed)
No egg or soy allergy known to patient  No issues with past sedation with any surgeries  or procedures, no intubation problems  No diet pills per patient No home 02 use per patient  No blood thinners per patient  Pt denies issues with constipation  No A fib or A flutter  EMMI video sent to pt's e mail  

## 2017-07-19 ENCOUNTER — Telehealth: Payer: Self-pay | Admitting: Gastroenterology

## 2017-07-19 NOTE — Telephone Encounter (Signed)
Pt's insurance will not cover plenvu- pt has procedure wed 1-9  Will give sample-- plenvu  Lot [id number]  Exp 12/2018 as directed   pt to pick up 4th floor today   Lelan Pons pV

## 2017-07-21 ENCOUNTER — Encounter: Payer: Self-pay | Admitting: Gastroenterology

## 2017-07-21 ENCOUNTER — Other Ambulatory Visit: Payer: Self-pay

## 2017-07-21 ENCOUNTER — Ambulatory Visit (AMBULATORY_SURGERY_CENTER): Payer: Federal, State, Local not specified - PPO | Admitting: Gastroenterology

## 2017-07-21 VITALS — BP 147/91 | HR 62 | Temp 96.4°F | Resp 20 | Ht 68.0 in | Wt 253.0 lb

## 2017-07-21 DIAGNOSIS — Z1212 Encounter for screening for malignant neoplasm of rectum: Secondary | ICD-10-CM

## 2017-07-21 DIAGNOSIS — Z1211 Encounter for screening for malignant neoplasm of colon: Secondary | ICD-10-CM | POA: Diagnosis not present

## 2017-07-21 MED ORDER — SODIUM CHLORIDE 0.9 % IV SOLN: 500.0000 mL | INTRAVENOUS | Status: DC

## 2017-07-21 NOTE — Progress Notes (Signed)
A and O x3. Report to RN. Tolerated MAC anesthesia well.

## 2017-07-21 NOTE — Patient Instructions (Signed)
YOU HAD AN ENDOSCOPIC PROCEDURE TODAY AT [facility]:   Refer to the procedure report that was given to you for any specific questions about what was found during the examination.  If the procedure report does not answer your questions, please call your gastroenterologist to clarify.  If you requested that your care partner not be given the details of your procedure findings, then the procedure report has been included in a sealed envelope for you to review at your convenience later.  YOU SHOULD EXPECT: Some feelings of bloating in the abdomen. Passage of more gas than usual.  Walking can help get rid of the air that was put into your GI tract during the procedure and reduce the bloating. If you had a lower endoscopy (such as a colonoscopy or flexible sigmoidoscopy) you may notice spotting of blood in your stool or on the toilet paper. If you underwent a bowel prep for your procedure, you may not have a normal bowel movement for a few days.  Please Note:  You might notice some irritation and congestion in your nose or some drainage.  This is from the oxygen used during your procedure.  There is no need for concern and it should clear up in a day or so.  SYMPTOMS TO REPORT IMMEDIATELY:   Following lower endoscopy (colonoscopy or flexible sigmoidoscopy):  Excessive amounts of blood in the stool  Significant tenderness or worsening of abdominal pains  Swelling of the abdomen that is new, acute  Fever of 100F or higher  For urgent or emergent issues, a gastroenterologist can be reached at any hour by calling [phone].   DIET:  We do recommend a small meal at first, but then you may proceed to your regular diet.  Drink plenty of fluids but you should avoid alcoholic beverages for 24 hours.  ACTIVITY:  You should plan to take it easy for the rest of today and you should NOT DRIVE or use heavy machinery until tomorrow (because of the sedation medicines used during the test).     FOLLOW UP: Our staff will call the number listed on your records the next business day following your procedure to check on you and address any questions or concerns that you may have regarding the information given to you following your procedure. If we do not reach you, we will leave a message.  However, if you are feeling well and you are not experiencing any problems, there is no need to return our call.  We will assume that you have returned to your regular daily activities without incident.  If any biopsies were taken you will be contacted by phone or by letter within the next 1-3 weeks.  Please call us at [phone] if you have not heard about the biopsies in 3 weeks.   Repeat Colonoscopy screening in 10 years Diverticulosis (handout given) Hemorrhoids (handout given)  SIGNATURES/CONFIDENTIALITY: You and/or your care partner have signed paperwork which will be entered into your electronic medical record.  These signatures attest to the fact that that the information above on your After Visit Summary has been reviewed and is understood.  Full responsibility of the confidentiality of this discharge information lies with you and/or your care-partner.

## 2017-07-21 NOTE — Op Note (Signed)
[facility] Patient Name: Seila Liston Procedure Date: 07/21/2017 11:32 AM MRN: [id number] Endoscopist: Mauri Pole , MD Age: 52 Referring MD:  Date of Birth: [birth year] Gender: Female Account #: [id number] Procedure:                Colonoscopy Indications:              Screening for colorectal malignant neoplasm Medicines:                Monitored Anesthesia Care Procedure:                Pre-Anesthesia Assessment:                           - Prior to the procedure, a History and Physical                            was performed, and patient medications and                            allergies were reviewed. The patient's tolerance of                            previous anesthesia was also reviewed. The risks                            and benefits of the procedure and the sedation                            options and risks were discussed with the patient.                            All questions were answered, and informed consent                            was obtained. Prior Anticoagulants: The patient has                            taken no previous anticoagulant or antiplatelet                            agents. ASA Grade Assessment: II - A patient with                            mild systemic disease. After reviewing the risks                            and benefits, the patient was deemed in                            satisfactory condition to undergo the procedure.                           After obtaining informed consent, the colonoscope  was passed under direct vision. Throughout the                            procedure, the patient's blood pressure, pulse, and                            oxygen saturations were monitored continuously. The                            Colonoscope was introduced through the anus and                            advanced to the the cecum, identified by                            appendiceal orifice and  ileocecal valve. The                            colonoscopy was performed without difficulty. The                            patient tolerated the procedure well. The quality                            of the bowel preparation was excellent. The                            ileocecal valve, appendiceal orifice, and rectum                            were photographed. Scope In: 11:36:27 AM Scope Out: 91:47:82 AM Scope Withdrawal Time: 0 hours 10 minutes 57 seconds  Total Procedure Duration: 0 hours 13 minutes 7 seconds  Findings:                 The perianal and digital rectal examinations were                            normal.                           Multiple small and large-mouthed diverticula were                            found in the sigmoid colon and descending colon.                           Non-bleeding internal hemorrhoids were found during                            retroflexion. The hemorrhoids were small. Complications:            No immediate complications. Estimated Blood Loss:     Estimated blood loss: none. Impression:               - Diverticulosis in the sigmoid colon and in the  descending colon.                           - Non-bleeding internal hemorrhoids.                           - No specimens collected. Recommendation:           - Patient has a contact number available for                            emergencies. The signs and symptoms of potential                            delayed complications were discussed with the                            patient. Return to normal activities tomorrow.                            Written discharge instructions were provided to the                            patient.                           - Resume previous diet.                           - Continue present medications.                           - Repeat colonoscopy in 10 years for screening                            purposes. Mauri Pole, MD 07/21/2017 11:54:32 AM This report has been signed electronically.

## 2017-07-22 ENCOUNTER — Telehealth: Payer: Self-pay | Admitting: *Deleted

## 2017-07-22 NOTE — Telephone Encounter (Signed)
  Follow up Call-  Call back number 07/21/2017  Post procedure Call Back phone  # [phone]  Permission to leave phone message Yes  Some recent data might be hidden     Patient questions:  Do you have a fever, pain , or abdominal swelling? No. Pain Score  0 *  Have you tolerated food without any problems? Yes.    Have you been able to return to your normal activities? Yes.    Do you have any questions about your discharge instructions: Diet   No. Medications  No. Follow up visit  No.  Do you have questions or concerns about your Care? No.  Actions: * If pain score is 4 or above: No action needed, pain <4.

## 2017-08-04 ENCOUNTER — Ambulatory Visit
Admission: RE | Admit: 2017-08-04 | Discharge: 2017-08-04 | Disposition: A | Discharge status: Home / Self Care | Payer: Federal, State, Local not specified - PPO | Source: Ambulatory Visit | Attending: Physician Assistant | Admitting: Physician Assistant

## 2017-08-04 DIAGNOSIS — Z1231 Encounter for screening mammogram for malignant neoplasm of breast: Secondary | ICD-10-CM

## 2017-09-10 ENCOUNTER — Encounter: Payer: Federal, State, Local not specified - PPO | Admitting: Physician Assistant

## 2017-09-16 DIAGNOSIS — J069 Acute upper respiratory infection, unspecified: Secondary | ICD-10-CM | POA: Diagnosis not present

## 2017-09-16 DIAGNOSIS — I10 Essential (primary) hypertension: Secondary | ICD-10-CM | POA: Diagnosis not present

## 2017-09-17 ENCOUNTER — Ambulatory Visit: Payer: Federal, State, Local not specified - PPO | Admitting: Family Medicine

## 2017-09-30 ENCOUNTER — Ambulatory Visit (INDEPENDENT_AMBULATORY_CARE_PROVIDER_SITE_OTHER): Payer: Federal, State, Local not specified - PPO | Admitting: Physician Assistant

## 2017-09-30 ENCOUNTER — Other Ambulatory Visit: Payer: Self-pay

## 2017-09-30 ENCOUNTER — Encounter: Payer: Self-pay | Admitting: Physician Assistant

## 2017-09-30 VITALS — BP 130/82 | HR 76 | Resp 16 | Ht 68.0 in | Wt 248.0 lb

## 2017-09-30 DIAGNOSIS — D72829 Elevated white blood cell count, unspecified: Secondary | ICD-10-CM

## 2017-09-30 DIAGNOSIS — E559 Vitamin D deficiency, unspecified: Secondary | ICD-10-CM

## 2017-09-30 DIAGNOSIS — I10 Essential (primary) hypertension: Secondary | ICD-10-CM | POA: Diagnosis not present

## 2017-09-30 DIAGNOSIS — Z6838 Body mass index (BMI) 38.0-38.9, adult: Secondary | ICD-10-CM | POA: Diagnosis not present

## 2017-09-30 DIAGNOSIS — E785 Hyperlipidemia, unspecified: Secondary | ICD-10-CM

## 2017-09-30 DIAGNOSIS — R809 Proteinuria, unspecified: Secondary | ICD-10-CM | POA: Diagnosis not present

## 2017-09-30 DIAGNOSIS — R51 Headache: Secondary | ICD-10-CM | POA: Diagnosis not present

## 2017-09-30 DIAGNOSIS — R519 Headache, unspecified: Secondary | ICD-10-CM

## 2017-09-30 DIAGNOSIS — Z0000 Encounter for general adult medical examination without abnormal findings: Secondary | ICD-10-CM

## 2017-09-30 MED ORDER — VITAMIN D (ERGOCALCIFEROL) 1.25 MG (50000 UNIT) PO CAPS: 50000.0000 [IU] | ORAL_CAPSULE | ORAL | 3 refills | Status: AC

## 2017-09-30 MED ORDER — HYDROCHLOROTHIAZIDE 12.5 MG PO CAPS: 12.5000 mg | ORAL_CAPSULE | Freq: Every day | ORAL | 3 refills | Status: AC

## 2017-09-30 MED ORDER — ENALAPRIL MALEATE 10 MG PO TABS: ORAL_TABLET | ORAL | 3 refills | Status: AC

## 2017-09-30 MED ORDER — PRAVASTATIN SODIUM 40 MG PO TABS
40.0000 mg | ORAL_TABLET | Freq: Every evening | ORAL | 3 refills | Status: AC
Start: 2017-09-30 — End: 2018-09-25

## 2017-09-30 NOTE — Assessment & Plan Note (Signed)
Await labs. Adjust regimen as indicated by results. Continue pravastatin 40 mg. If needs additional lowering, would change to atorvastatin or rosuvastatin.

## 2017-09-30 NOTE — Assessment & Plan Note (Signed)
Well controlled. Continue current treatment with enalapril 10 mg QD, HCTZ 12.5 mg QD, metoprolol succinate 100 mg QD

## 2017-09-30 NOTE — Progress Notes (Signed)
Patient ID: Kathleen Mata, female    DOB: 1966-03-20, 52 y.o.   MRN: [id number]  PCP: Harrison Mons, PA-C  Chief Complaint  Patient presents with  . Annual Exam    Subjective:   Presents for [employer].  Cervical Cancer Screening: 08/26/2016, cytology and HPV negative. Repeat in 2023. Breast Cancer Screening: 08/04/17 Colorectal Cancer Screening: 07/21/2017 Bone Density Testing: not yet a candidate HIV Screening: 08/26/16 STI Screening: low risk, declines Seasonal Influenza Vaccination: 05/02/17 Td/Tdap Vaccination: 07/04/09 Pneumococcal Vaccination: not yet a candidate Zoster Vaccination: not yet Frequency of Dental evaluation: Q6 months Frequency of Eye evaluation: "it's time to go."  Doing well overall, working on healthy lifestyle changes. Exercising regularly, but typically eats a large meal after.   Review of Systems  Constitutional: Positive for chills and fatigue. Negative for fever.       Resolved, was around her boyfriend's daughter, who had a cold  HENT: Positive for rhinorrhea. Negative for congestion.        Better today, was around her boyfriend's daughter, who had a cold, several days ago  Eyes: Negative for pain and visual disturbance.  Respiratory: Negative for cough, chest tightness, shortness of breath and wheezing.   Cardiovascular: Negative for chest pain, palpitations and leg swelling.  Gastrointestinal: Negative for abdominal pain, blood in stool, constipation, diarrhea, nausea and vomiting.  Endocrine: Negative for polydipsia.  Genitourinary: Negative for dysuria, frequency, hematuria and urgency.  Musculoskeletal: Negative for arthralgias, back pain, myalgias and neck pain.  Skin: Negative.  Negative for rash.  Allergic/Immunologic: Negative for environmental allergies.  Neurological: Negative for dizziness, weakness and headaches.  Hematological: Does not bruise/bleed easily.  Psychiatric/Behavioral: Negative for decreased  concentration and suicidal ideas. The patient is not nervous/anxious.     Patient Active Problem List   Diagnosis Date Noted  . Leucocytosis 06/26/2015  . Hyperlipidemia 08/17/2014  . Essential hypertension, benign   . Microalbuminuria   . Vitamin D deficiency   . BMI 38.0-38.9,adult   . Fibroids   . Headache     Past Medical History:  Diagnosis Date  . Essential hypertension, benign   . Fibroids   . Headache(784.0)   . Hyperlipidemia   . Microalbuminuria   . Obesity, unspecified   . Vitamin D deficiency      Prior to Admission medications   Medication Sig Start Date End Date Taking? Authorizing Provider  enalapril (VASOTEC) 10 MG tablet TAKE 1 TABLET(10 MG) BY MOUTH DAILY 09/03/16  Yes Nizhoni Parlow, PA-C  hydrochlorothiazide (MICROZIDE) 12.5 MG capsule Take 1 capsule (12.5 mg total) by mouth daily. 06/08/17  Yes Domingos Riggi, PA-C  lidocaine (XYLOCAINE) 2 % solution  09/16/17  Yes [provider]  metoprolol succinate (TOPROL-XL) 100 MG 24 hr tablet TAKE 1 TABLET(100 MG) BY MOUTH DAILY 06/08/17  Yes Prince Couey, PA-C  pravastatin (PRAVACHOL) 40 MG tablet Take 1 tablet (40 mg total) by mouth every evening. 09/03/16 11/24/17 Yes Tadeusz Stahl, PA-C  Vitamin D, Ergocalciferol, (DRISDOL) 50000 units CAPS capsule Take 1 capsule (50,000 Units total) by mouth every 7 (seven) days. 09/03/16  Yes Harrison Mons, PA-C  fluticasone (FLONASE) 50 MCG/ACT nasal spray  09/16/17   [provider]    No Known Allergies  Past Surgical History:  Procedure Laterality Date  . ENDOMETRIAL ABLATION    . WISDOM TOOTH EXTRACTION      Family History  Problem Relation Age of Onset  . Hypertension Mother   . Heart disease Mother   .  Hypertension Father   . Kidney disease Father   . Sarcoidosis Brother 73  . Colon cancer Neg Hx   . Colon polyps Neg Hx   . Esophageal cancer Neg Hx   . Rectal cancer Neg Hx   . Stomach cancer Neg Hx     Social History    Socioeconomic History  . Marital status: Single    Spouse name: n/a  . Number of children: 1  . Years of education: 50  . Highest education level: Not on file  Occupational History  . Occupation: Armed forces operational officer: [employer]    Comment: [place]  . Financial resource strain: Not hard at all  . Food insecurity:    Worry: Never true    Inability: Never true  . Transportation needs:    Medical: No    Non-medical: No  Tobacco Use  . Smoking status: Never Smoker  . Smokeless tobacco: Never Used  Substance and Sexual Activity  . Alcohol use: Yes    Alcohol/week: 1.2 - 1.8 oz    Types: 2 - 3 Standard drinks or equivalent per week  . Drug use: No  . Sexual activity: Yes    Birth control/protection: None, Other-see comments    Comment: s/p ablation  Lifestyle  . Physical activity:    Days per week: 4 days    Minutes per session: 150+ min  . Stress: Not at all  Relationships  . Social connections:    Talks on phone: More than three times a week    Gets together: Once a week    Attends religious service: Never    Active member of club or organization: Yes    Attends meetings of clubs or organizations: More than 4 times per year    Relationship status: Never married  Other Topics Concern  . Not on file  Social History Narrative   Patient is right handed, consumes caffeine rarely.   Her son, [name], lives with her. He graduated from high school 12/2015.   Has a boyfriend, with whom she is working on healthy lifestyle changes.        Objective:  Physical Exam  Constitutional: She is oriented to person, place, and time. Vital signs are normal. She appears well-developed and well-nourished. She is active and cooperative. No distress.  BP 130/82   Pulse 76   Resp 16   Ht 5\' 8"  (1.727 m)   Wt 248 lb (112.5 kg)   LMP 08/12/2016 Comment: "spotting, with cramps"  SpO2 98%   BMI 37.71 kg/m    HENT:  Head: Normocephalic and atraumatic.  Right Ear:  Hearing, tympanic membrane, external ear and ear canal normal. No foreign bodies.  Left Ear: Hearing, tympanic membrane, external ear and ear canal normal. No foreign bodies.  Nose: Nose normal.  Mouth/Throat: Uvula is midline, oropharynx is clear and moist and mucous membranes are normal. No oral lesions. Normal dentition. No dental abscesses or uvula swelling. No oropharyngeal exudate.  Eyes: Pupils are equal, round, and reactive to light. Conjunctivae, EOM and lids are normal. Right eye exhibits no discharge. Left eye exhibits no discharge. No scleral icterus.  Fundoscopic exam:      The right eye shows no arteriolar narrowing, no AV nicking, no exudate, no hemorrhage and no papilledema. The right eye shows red reflex.       The left eye shows no arteriolar narrowing, no AV nicking, no exudate, no hemorrhage and no papilledema. The left  eye shows red reflex.  Neck: Trachea normal, normal range of motion and full passive range of motion without pain. Neck supple. No spinous process tenderness and no muscular tenderness present. No thyroid mass and no thyromegaly present.  Cardiovascular: Normal rate, regular rhythm, normal heart sounds, intact distal pulses and normal pulses.  Pulmonary/Chest: Effort normal and breath sounds normal. Right breast exhibits inverted nipple. Right breast exhibits no mass, no nipple discharge, no skin change and no tenderness. Left breast exhibits inverted nipple. Left breast exhibits no mass, no nipple discharge, no skin change and no tenderness. Breasts are symmetrical.  Nipple inversion is long-standing/baseline for this patient  Abdominal: Soft. Bowel sounds are normal. She exhibits no distension and no mass. There is no tenderness. There is no rebound and no guarding.  Musculoskeletal: She exhibits no edema or tenderness.       Cervical back: Normal.       Thoracic back: Normal.       Lumbar back: Normal.  Lymphadenopathy:       Head (right side): No tonsillar,  no preauricular, no posterior auricular and no occipital adenopathy present.       Head (left side): No tonsillar, no preauricular, no posterior auricular and no occipital adenopathy present.    She has no cervical adenopathy.       Right: No supraclavicular adenopathy present.       Left: No supraclavicular adenopathy present.  Neurological: She is alert and oriented to person, place, and time. She has normal strength and normal reflexes. No cranial nerve deficit. She exhibits normal muscle tone. Coordination and gait normal.  Skin: Skin is warm, dry and intact. No rash noted. She is not diaphoretic. No cyanosis or erythema. Nails show no clubbing.  Psychiatric: She has a normal mood and affect. Her speech is normal and behavior is normal. Judgment and thought content normal.     Wt Readings from Last 3 Encounters:  09/30/17 248 lb (112.5 kg)  07/21/17 253 lb (114.8 kg)  07/08/17 253 lb 12.8 oz (115.1 kg)     Visual Acuity Screening   Right eye Left eye Both eyes  Without correction: 20/20 20/20 20/20   With correction:            Assessment & Plan:   Problem List Items Addressed This Visit    Essential hypertension, benign    Well controlled. Continue current treatment with enalapril 10 mg QD, HCTZ 12.5 mg QD, metoprolol succinate 100 mg QD      Relevant Medications   enalapril (VASOTEC) 10 MG tablet   hydrochlorothiazide (MICROZIDE) 12.5 MG capsule   pravastatin (PRAVACHOL) 40 MG tablet   Other Relevant Orders   CBC with Differential/Platelet   Comprehensive metabolic panel   TSH   Microalbumin / creatinine urine ratio   Urinalysis, dipstick only   Microalbuminuria    Continue control of HTN.       Relevant Orders   Comprehensive metabolic panel   Microalbumin / creatinine urine ratio   Vitamin D deficiency    Update lab today.      Relevant Medications   Vitamin D, Ergocalciferol, (DRISDOL) 50000 units CAPS capsule   Other Relevant Orders   VITAMIN D 25  Hydroxy (Vit-D Deficiency, Fractures)   BMI 38.0-38.9,adult    Has lost 5 lbs in the past couple of months with healthy lifestyle changes. Encourage continued efforts. Counseled specifically on eating choices following exercise.      Headache    Stable.  Hyperlipidemia    Await labs. Adjust regimen as indicated by results. Continue pravastatin 40 mg. If needs additional lowering, would change to atorvastatin or rosuvastatin.      Relevant Medications   enalapril (VASOTEC) 10 MG tablet   hydrochlorothiazide (MICROZIDE) 12.5 MG capsule   pravastatin (PRAVACHOL) 40 MG tablet   Other Relevant Orders   Comprehensive metabolic panel   Lipid panel   Leucocytosis    Has been stable. Await lab results.      Relevant Orders   CBC with Differential/Platelet    Other Visit Diagnoses    Annual physical exam    -  Primary   Age appropriate ehalth guidance provided.       Return in about 6 months (around 04/02/2018) for re-evalaution of blood pressure, cholesterol.   Fara Chute, PA-C [facility]

## 2017-09-30 NOTE — Assessment & Plan Note (Signed)
Stable

## 2017-09-30 NOTE — Assessment & Plan Note (Signed)
Update lab today.

## 2017-09-30 NOTE — Assessment & Plan Note (Signed)
Has lost 5 lbs in the past couple of months with healthy lifestyle changes. Encourage continued efforts. Counseled specifically on eating choices following exercise.

## 2017-09-30 NOTE — Patient Instructions (Addendum)
IF you received an x-ray today, you will receive an invoice from [facility]. Please contact [facility] at [phone] with questions or concerns regarding your invoice.   IF you received labwork today, you will receive an invoice from [facility]. Please contact [facility] at [phone] with questions or concerns regarding your invoice.   Our billing staff will not be able to assist you with questions regarding bills from these companies.  You will be contacted with the lab results as soon as they are available. The fastest way to get your results is to activate your My Chart account. Instructions are located on the last page of this paperwork. If you have not heard from [place] regarding the results in 2 weeks, please contact this office.      Preventive Care 40-64 Years, Female Preventive care refers to lifestyle choices and visits with your health care provider that can promote health and wellness. What does preventive care include?  A yearly physical exam. This is also called an annual well check.  Dental exams once or twice a year.  Routine eye exams. Ask your health care provider how often you should have your eyes checked.  Personal lifestyle choices, including: ? Daily care of your teeth and gums. ? Regular physical activity. ? Eating a healthy diet. ? Avoiding tobacco and drug use. ? Limiting alcohol use. ? Practicing safe sex. ? Taking low-dose aspirin daily starting at age 34. ? Taking vitamin and mineral supplements as recommended by your health care provider. What happens during an annual well check? The services and screenings done by your health care provider during your annual well check will depend on your age, overall health, lifestyle risk factors, and family history of disease. Counseling Your health care provider may ask you questions about your:  Alcohol use.  Tobacco use.  Drug use.  Emotional well-being.  Home and relationship  well-being.  Sexual activity.  Eating habits.  Work and work Statistician.  Method of birth control.  Menstrual cycle.  Pregnancy history.  Screening You may have the following tests or measurements:  Height, weight, and BMI.  Blood pressure.  Lipid and cholesterol levels. These may be checked every 5 years, or more frequently if you are over 65 years old.  Skin check.  Lung cancer screening. You may have this screening every year starting at age 102 if you have a 30-pack-year history of smoking and currently smoke or have quit within the past 15 years.  Fecal occult blood test (FOBT) of the stool. You may have this test every year starting at age 33.  Flexible sigmoidoscopy or colonoscopy. You may have a sigmoidoscopy every 5 years or a colonoscopy every 10 years starting at age 23.  Hepatitis C blood test.  Hepatitis B blood test.  Sexually transmitted disease (STD) testing.  Diabetes screening. This is done by checking your blood sugar (glucose) after you have not eaten for a while (fasting). You may have this done every 1-3 years.  Mammogram. This may be done every 1-2 years. Talk to your health care provider about when you should start having regular mammograms. This may depend on whether you have a family history of breast cancer.  BRCA-related cancer screening. This may be done if you have a family history of breast, ovarian, tubal, or peritoneal cancers.  Pelvic exam and Pap test. This may be done every 3 years starting at age 3. Starting at age 55, this may be done every 5 years if  you have a Pap test in combination with an HPV test.  Bone density scan. This is done to screen for osteoporosis. You may have this scan if you are at high risk for osteoporosis.  Discuss your test results, treatment options, and if necessary, the need for more tests with your health care provider. Vaccines Your health care provider may recommend certain vaccines, such  as:  Influenza vaccine. This is recommended every year.  Tetanus, diphtheria, and acellular pertussis (Tdap, Td) vaccine. You may need a Td booster every 10 years.  Varicella vaccine. You may need this if you have not been vaccinated.  Zoster vaccine. You may need this after age 77.  Measles, mumps, and rubella (MMR) vaccine. You may need at least one dose of MMR if you were born in 1957 or later. You may also need a second dose.  Pneumococcal 13-valent conjugate (PCV13) vaccine. You may need this if you have certain conditions and were not previously vaccinated.  Pneumococcal polysaccharide (PPSV23) vaccine. You may need one or two doses if you smoke cigarettes or if you have certain conditions.  Meningococcal vaccine. You may need this if you have certain conditions.  Hepatitis A vaccine. You may need this if you have certain conditions or if you travel or work in places where you may be exposed to hepatitis A.  Hepatitis B vaccine. You may need this if you have certain conditions or if you travel or work in places where you may be exposed to hepatitis B.  Haemophilus influenzae type b (Hib) vaccine. You may need this if you have certain conditions.  Talk to your health care provider about which screenings and vaccines you need and how often you need them. This information is not intended to replace advice given to you by your health care provider. Make sure you discuss any questions you have with your health care provider. Document Released: 07/26/2015 Document Revised: 03/18/2016 Document Reviewed: 04/30/2015 Elsevier Interactive Patient Education  [employer].

## 2017-09-30 NOTE — Assessment & Plan Note (Signed)
Has been stable. Await lab results.

## 2017-09-30 NOTE — Assessment & Plan Note (Signed)
Continue control of HTN.

## 2017-10-01 LAB — CBC WITH DIFFERENTIAL/PLATELET
Basophils Absolute: 0 10*3/uL (ref 0.0–0.2)
Basos: 0 %
EOS (ABSOLUTE): 0.1 10*3/uL (ref 0.0–0.4)
Eos: 1 %
HEMATOCRIT: 43.6 % (ref 34.0–46.6)
Hemoglobin: 14.3 g/dL (ref 11.1–15.9)
Immature Grans (Abs): 0 10*3/uL (ref 0.0–0.1)
Immature Granulocytes: 0 %
LYMPHS ABS: 1.5 10*3/uL (ref 0.7–3.1)
Lymphs: 27 %
MCH: 30.4 pg (ref 26.6–33.0)
MCHC: 32.8 g/dL (ref 31.5–35.7)
MCV: 93 fL (ref 79–97)
MONOS ABS: 0.6 10*3/uL (ref 0.1–0.9)
Monocytes: 11 %
Neutrophils Absolute: 3.3 10*3/uL (ref 1.4–7.0)
Neutrophils: 61 %
Platelets: 255 10*3/uL (ref 150–379)
RBC: 4.71 x10E6/uL (ref 3.77–5.28)
RDW: 13.8 % (ref 12.3–15.4)
WBC: 5.5 10*3/uL (ref 3.4–10.8)

## 2017-10-01 LAB — COMPREHENSIVE METABOLIC PANEL
ALBUMIN: 4.2 g/dL (ref 3.5–5.5)
ALK PHOS: 88 IU/L (ref 39–117)
ALT: 30 IU/L (ref 0–32)
AST: 25 IU/L (ref 0–40)
Albumin/Globulin Ratio: 1.3 (ref 1.2–2.2)
BILIRUBIN TOTAL: 0.4 mg/dL (ref 0.0–1.2)
BUN / CREAT RATIO: 10 (ref 9–23)
BUN: 12 mg/dL (ref 6–24)
CHLORIDE: 106 mmol/L (ref 96–106)
CO2: 21 mmol/L (ref 20–29)
CREATININE: 1.15 mg/dL — AB (ref 0.57–1.00)
Calcium: 8.7 mg/dL (ref 8.7–10.2)
GFR calc Af Amer: 64 mL/min/{1.73_m2} (ref >59)
GFR calc non Af Amer: 55 mL/min/{1.73_m2} — ABNORMAL LOW (ref >59)
GLUCOSE: 101 mg/dL — AB (ref 65–99)
Globulin, Total: 3.3 g/dL (ref 1.5–4.5)
Potassium: 4.1 mmol/L (ref 3.5–5.2)
Sodium: 142 mmol/L (ref 134–144)
Total Protein: 7.5 g/dL (ref 6.0–8.5)

## 2017-10-01 LAB — LIPID PANEL
CHOLESTEROL TOTAL: 205 mg/dL — AB (ref 100–199)
Chol/HDL Ratio: 3.3 ratio (ref 0.0–4.4)
HDL: 63 mg/dL (ref >39)
LDL CALC: 124 mg/dL — AB (ref 0–99)
TRIGLYCERIDES: 91 mg/dL (ref 0–149)
VLDL CHOLESTEROL CAL: 18 mg/dL (ref 5–40)

## 2017-10-01 LAB — URINALYSIS, DIPSTICK ONLY
BILIRUBIN UA: NEGATIVE
GLUCOSE, UA: NEGATIVE
Ketones, UA: NEGATIVE
Leukocytes, UA: NEGATIVE
Nitrite, UA: NEGATIVE
PH UA: 5.5 (ref 5.0–7.5)
RBC UA: NEGATIVE
Specific Gravity, UA: 1.027 (ref 1.005–1.030)
UUROB: 0.2 mg/dL (ref 0.2–1.0)

## 2017-10-01 LAB — TSH: TSH: 2.1 u[IU]/mL (ref 0.450–4.500)

## 2017-10-01 LAB — MICROALBUMIN / CREATININE URINE RATIO
CREATININE, UR: 221.8 mg/dL
MICROALBUM., U, RANDOM: 2619.2 ug/mL
Microalb/Creat Ratio: 1180.9 mg/g creat — ABNORMAL HIGH (ref 0.0–30.0)

## 2017-10-01 LAB — VITAMIN D 25 HYDROXY (VIT D DEFICIENCY, FRACTURES): VIT D 25 HYDROXY: 25.1 ng/mL — AB (ref 30.0–100.0)

## 2017-10-06 ENCOUNTER — Other Ambulatory Visit: Payer: Self-pay

## 2017-10-06 ENCOUNTER — Ambulatory Visit: Payer: Federal, State, Local not specified - PPO | Admitting: Physician Assistant

## 2017-10-06 ENCOUNTER — Encounter: Payer: Self-pay | Admitting: Physician Assistant

## 2017-10-06 VITALS — BP 138/70 | HR 77 | Temp 99.3°F | Resp 18 | Ht 68.0 in | Wt 246.6 lb

## 2017-10-06 DIAGNOSIS — B9689 Other specified bacterial agents as the cause of diseases classified elsewhere: Secondary | ICD-10-CM | POA: Diagnosis not present

## 2017-10-06 DIAGNOSIS — J019 Acute sinusitis, unspecified: Secondary | ICD-10-CM

## 2017-10-06 MED ORDER — AMOXICILLIN-POT CLAVULANATE 875-125 MG PO TABS: 1.0000 | ORAL_TABLET | Freq: Two times a day (BID) | ORAL | 0 refills | Status: AC

## 2017-10-06 NOTE — Patient Instructions (Addendum)
If the Augmentin fails to ameliorate your symptoms I would recommend trying some medication for allergies.  You could consider taking Zyrtec-D.     IF you received an x-ray today, you will receive an invoice from [facility]. Please contact [facility] at [phone] with questions or concerns regarding your invoice.   IF you received labwork today, you will receive an invoice from [facility]. Please contact [facility] at [phone] with questions or concerns regarding your invoice.   Our billing staff will not be able to assist you with questions regarding bills from these companies.  You will be contacted with the lab results as soon as they are available. The fastest way to get your results is to activate your My Chart account. Instructions are located on the last page of this paperwork. If you have not heard from [place] regarding the results in 2 weeks, please contact this office.

## 2017-10-06 NOTE — Progress Notes (Signed)
10/06/2017 9:44 AM   DOB: 11/12/1965 / MRN: [id number]  SUBJECTIVE:  Kathleen Mata is a 52 y.o. female presenting for sinus pain and pressure along with headache for 10 days.  Patient was seen at a local urgent care roughly 7 days ago and was advised that she most likely has a virus and was given some over-the-counter treatments which she reports has not really helped her symptoms.  She denies fever and teeth pain.  She has No Known Allergies.   She  has a past medical history of Essential hypertension, benign, Fibroids, Headache(784.0), Hyperlipidemia, Microalbuminuria, Obesity, unspecified, and Vitamin D deficiency.    She  reports that she has never smoked. She has never used smokeless tobacco. She reports that she drinks about 1.2 - 1.8 oz of alcohol per week. She reports that she does not use drugs. She  reports that she currently engages in sexual activity. She reports using the following methods of birth control/protection: None and Other-see comments. The patient  has a past surgical history that includes Wisdom tooth extraction and Endometrial ablation.  Her family history includes Heart disease in her mother; Hypertension in her father and mother; Kidney disease in her father; Sarcoidosis (age of onset: 79) in her brother.  ROS per HPI  The problem list and medications were reviewed and updated by myself where necessary and exist elsewhere in the encounter.   OBJECTIVE:  BP 138/70   Pulse 77   Temp 99.3 F (37.4 C) (Oral)   Resp 18   Ht 5\' 8"  (1.727 m)   Wt 246 lb 9.6 oz (111.9 kg)   LMP 08/12/2016 Comment: "spotting, with cramps"  SpO2 97%   BMI 37.50 kg/m   Physical Exam  Constitutional: She is oriented to person, place, and time. She is active.  Non-toxic appearance.  Eyes: Pupils are equal, round, and reactive to light. EOM are normal.  Cardiovascular: Normal rate, regular rhythm, S1 normal, S2 normal, normal heart sounds and intact distal pulses. Exam reveals no  gallop, no friction rub and no decreased pulses.  No murmur heard. Pulmonary/Chest: Effort normal. No stridor. No tachypnea. No respiratory distress. She has no wheezes. She has no rales.  Abdominal: She exhibits no distension.  Musculoskeletal: She exhibits no edema.  Neurological: She is alert and oriented to person, place, and time. She has normal strength and normal reflexes. She is not disoriented. She displays no atrophy. No cranial nerve deficit or sensory deficit. She exhibits normal muscle tone. Coordination and gait normal.  Skin: Skin is warm and dry. She is not diaphoretic. No pallor.  Psychiatric: Her behavior is normal.    No results found for this or any previous visit (from the past 72 hour(s)).  No results found.  ASSESSMENT AND PLAN:  Kathleen Mata was seen today for sore throat, nasal congestion and cough.  Diagnoses and all orders for this visit:  Acute bacterial rhinosinusitis Comments: Patient advised to go ahead and start Augmentin.  This could be allergic however she is not having those typical symptoms such as sneezing, eye itching.  If the Augmentin fails to remiss her symptoms I have advised that she could consider starting Zyrtec-D. Orders: -     amoxicillin-clavulanate (AUGMENTIN) 875-125 MG tablet; Take 1 tablet by mouth 2 (two) times daily.    The patient is advised to call or return to clinic if she does not see an improvement in symptoms, or to seek the care of the closest emergency department if she  worsens with the above plan.   Philis Fendt, MHS, PA-C [facility] 10/06/2017 9:44 AM

## 2017-10-18 ENCOUNTER — Encounter: Payer: Self-pay | Admitting: Physician Assistant

## 2017-11-04 ENCOUNTER — Telehealth: Payer: Self-pay | Admitting: Physician Assistant

## 2017-11-04 NOTE — Telephone Encounter (Signed)
Called and spoke with pt regarding Chelle leaving the practice. She said that she would call back to the office and make an appt with a different provider. When she calls in, please schedule her with one of the following providers that are accepting new pts. - Carlis Abbott, Marga Hoots, Etheleen Nicks, [place] or [facility].  Thanks!

## 2018-04-02 DIAGNOSIS — I10 Essential (primary) hypertension: Secondary | ICD-10-CM | POA: Diagnosis not present

## 2018-04-02 DIAGNOSIS — S29012A Strain of muscle and tendon of back wall of thorax, initial encounter: Secondary | ICD-10-CM | POA: Diagnosis not present

## 2018-04-06 ENCOUNTER — Ambulatory Visit: Payer: Federal, State, Local not specified - PPO | Admitting: Physician Assistant

## 2018-04-06 DIAGNOSIS — I10 Essential (primary) hypertension: Secondary | ICD-10-CM | POA: Diagnosis not present

## 2018-04-06 DIAGNOSIS — E785 Hyperlipidemia, unspecified: Secondary | ICD-10-CM | POA: Diagnosis not present

## 2018-04-06 DIAGNOSIS — R809 Proteinuria, unspecified: Secondary | ICD-10-CM | POA: Diagnosis not present

## 2018-04-06 DIAGNOSIS — E559 Vitamin D deficiency, unspecified: Secondary | ICD-10-CM | POA: Diagnosis not present

## 2018-10-10 DIAGNOSIS — I10 Essential (primary) hypertension: Secondary | ICD-10-CM | POA: Diagnosis not present

## 2019-01-11 DIAGNOSIS — E785 Hyperlipidemia, unspecified: Secondary | ICD-10-CM | POA: Diagnosis not present

## 2019-01-11 DIAGNOSIS — I10 Essential (primary) hypertension: Secondary | ICD-10-CM | POA: Diagnosis not present

## 2019-01-11 DIAGNOSIS — E559 Vitamin D deficiency, unspecified: Secondary | ICD-10-CM | POA: Diagnosis not present

## 2019-04-26 DIAGNOSIS — E559 Vitamin D deficiency, unspecified: Secondary | ICD-10-CM | POA: Diagnosis not present

## 2019-04-26 DIAGNOSIS — F5101 Primary insomnia: Secondary | ICD-10-CM | POA: Diagnosis not present

## 2019-04-26 DIAGNOSIS — E785 Hyperlipidemia, unspecified: Secondary | ICD-10-CM | POA: Diagnosis not present

## 2019-04-26 DIAGNOSIS — I10 Essential (primary) hypertension: Secondary | ICD-10-CM | POA: Diagnosis not present

## 2019-05-18 DIAGNOSIS — N760 Acute vaginitis: Secondary | ICD-10-CM | POA: Diagnosis not present

## 2019-05-18 DIAGNOSIS — I10 Essential (primary) hypertension: Secondary | ICD-10-CM | POA: Diagnosis not present
